# Patient Record
Sex: Male | Born: 1960 | Race: Black or African American | Hispanic: No | Marital: Single | Smoking: Current every day smoker
Health system: Southern US, Community
[De-identification: ages and names within clinical notes are randomized; demographics above are authoritative.]

## PROBLEM LIST (undated history)

## (undated) DIAGNOSIS — M419 Scoliosis, unspecified: Secondary | ICD-10-CM

## (undated) DIAGNOSIS — K5900 Constipation, unspecified: Secondary | ICD-10-CM

## (undated) DIAGNOSIS — R1010 Upper abdominal pain, unspecified: Secondary | ICD-10-CM

## (undated) HISTORY — PX: WRIST SURGERY: SHX841

---

## 2012-06-14 LAB — CBC WITH AUTOMATED DIFF
ABS. BASOPHILS: 0 10*3/uL (ref 0.0–0.2)
ABS. EOSINOPHILS: 0.1 10*3/uL (ref 0.0–0.7)
ABS. LYMPHOCYTES: 1.2 10*3/uL (ref 1.2–3.4)
ABS. MONOCYTES: 0.3 10*3/uL — ABNORMAL LOW (ref 1.1–3.2)
ABS. NEUTROPHILS: 2.1 10*3/uL (ref 1.4–6.5)
BASOPHILS: 1 % (ref 0–2)
EOSINOPHILS: 1 % (ref 0–5)
HCT: 38.6 % (ref 36.8–45.2)
HGB: 13.5 g/dL (ref 12.8–15.0)
IMMATURE GRANULOCYTES: 0.5 % (ref 0.0–5.0)
LYMPHOCYTES: 33 % (ref 16–40)
MCH: 31.6 PG — ABNORMAL HIGH (ref 27–31)
MCHC: 35 g/dL (ref 32–36)
MCV: 90.4 FL (ref 81–99)
MONOCYTES: 7 % (ref 0–12)
MPV: 9.1 FL (ref 7.4–10.4)
NEUTROPHILS: 58 % (ref 40–70)
PLATELET: 324 10*3/uL (ref 140–450)
RBC: 4.27 M/uL (ref 4.0–5.2)
RDW: 12.3 % (ref 11.5–14.5)
WBC: 3.7 10*3/uL — ABNORMAL LOW (ref 4.8–10.8)

## 2012-06-14 LAB — URINE MICROSCOPIC
Casts: NONE SEEN /LPF
Crystals, urine: NONE SEEN /LPF
RBC: 0 /HPF
WBC: 0 /HPF

## 2012-06-14 LAB — URINALYSIS W/ RFLX MICROSCOPIC
Bilirubin: NEGATIVE
Blood: NEGATIVE
Glucose: NEGATIVE MG/DL
Ketone: NEGATIVE MG/DL
Nitrites: NEGATIVE
Protein: NEGATIVE MG/DL
Specific gravity: 1.01 (ref 1.003–1.030)
Urobilinogen: 0.2 EU/DL (ref 0.2–1.0)
pH (UA): 6 (ref 5.0–7.0)

## 2012-06-14 LAB — HEPATIC FUNCTION PANEL
A-G Ratio: 0.9 — ABNORMAL LOW (ref 1.0–3.1)
ALT (SGPT): 40 U/L (ref 12.0–78.0)
AST (SGOT): 35 U/L (ref 15–37)
Albumin: 3.3 g/dL — ABNORMAL LOW (ref 3.40–5.00)
Alk. phosphatase: 88 U/L (ref 50–136)
Bilirubin, direct: 0.1 MG/DL (ref 0.0–0.20)
Bilirubin, total: 0.3 MG/DL (ref 0.20–1.00)
Globulin: 3.7 g/dL
Protein, total: 7 g/dL (ref 6.40–8.20)

## 2012-06-14 LAB — PTT: aPTT: 26.4 s (ref 24.5–31.6)

## 2012-06-14 LAB — METABOLIC PANEL, BASIC
Anion gap: 12 mmol/L (ref 10–17)
BUN/Creatinine ratio: 20 (ref 6.0–20.0)
BUN: 14 MG/DL (ref 7–18)
CO2: 28 MMOL/L (ref 21–32)
Calcium: 8.8 MG/DL (ref 8.5–10.1)
Chloride: 101 MMOL/L (ref 98–107)
Creatinine: 0.7 MG/DL (ref 0.6–1.3)
GFR est AA: >60 mL/min/{1.73_m2} (ref >60)
GFR est non-AA: >60 mL/min/{1.73_m2} (ref >60)
Glucose: 104 MG/DL (ref 74–106)
Potassium: 4.1 MMOL/L (ref 3.50–5.10)
Sodium: 137 MMOL/L (ref 136–145)

## 2012-06-14 LAB — MAGNESIUM: Magnesium: 1.9 MG/DL (ref 1.8–2.4)

## 2012-06-14 LAB — PROTHROMBIN TIME + INR
INR: 1
Prothrombin time: 10.3 s (ref 9.8–12.0)

## 2012-06-14 LAB — LIPASE: Lipase: 71 U/L (ref 65–230)

## 2012-06-14 LAB — AMYLASE: Amylase: 41 U/L (ref 25–115)

## 2012-11-26 MED ADMIN — naproxen (NAPROSYN) tablet 500 mg: ORAL | @ 20:00:00 | NDC 53746018801

## 2012-11-26 MED FILL — NAPROXEN 250 MG TAB: 250 mg | ORAL | Qty: 2

## 2012-11-26 NOTE — ED Notes (Signed)
I have reviewed discharge instructions with the patient.  The patient verbalized understanding. Provided cane ice pack applied to R. Knee with ace wrap.

## 2012-11-26 NOTE — ED Provider Notes (Signed)
I was present in the ED and available for consultation.  I have read the note and agree with the plan.

## 2012-11-26 NOTE — ED Notes (Cosign Needed)
HPI:     This is a 52 y.o. year old male presents to the ED with complaints of anterior right and posterior knee pain that started 2 days ago. Patient denies trauma, states that he woke up with pain, describes pain as tight sensation and states that it is intermittent, aggravated by weight bearing, denies numbness or tingling in legs    I have provided an initial assessment and medical screening exam and he is to be placed in [facility] for further evaluation and treatment.    Review of Systems:  1)no fever, no chills   2) no sob, no cough     Physical Exam:  There were no vitals taken for this visit.  1) RRR, normal s1 and s2   2) no obvious knee deformity, able to bear weight     ----------------------------------------------------------------------------------    Past Medical/Surgical History:  No past medical history on file.    Family History:  No family history on file.    Social History:  History   Substance Use Topics   ??? Smoking status: Not on file   ??? Smokeless tobacco: Not on file   ??? Alcohol Use: Not on file       Allergies:  Allergies not on file    Medications:  No current facility-administered medications for this encounter.     No current outpatient prescriptions on file.

## 2012-11-26 NOTE — ED Provider Notes (Addendum)
Patient is a 52 y.o. male presenting with knee pain.   Knee Pain   This is a new problem. The current episode started 2 days ago. The problem occurs constantly. The problem has not changed since onset.The pain is present in the right knee. The quality of the pain is described as aching and constant. The pain is at a severity of 7/10. The pain is moderate. Associated symptoms include limited range of motion and stiffness. Pertinent negatives include no numbness, no tingling, no itching and no back pain. The symptoms are aggravated by movement. He has tried nothing for the symptoms. There has been no history of extremity trauma.        No past medical history on file.     No past surgical history on file.      No family history on file.     History     Social History   ??? Marital Status: SINGLE     Spouse Name: N/A     Number of Children: N/A   ??? Years of Education: N/A     Occupational History   ??? Not on file.     Social History Main Topics   ??? Smoking status: Not on file   ??? Smokeless tobacco: Not on file   ??? Alcohol Use: Not on file   ??? Drug Use: Not on file   ??? Sexually Active: Not on file     Other Topics Concern   ??? Not on file     Social History Narrative   ??? No narrative on file                  ALLERGIES: Review of patient's allergies indicates not on file.      Review of Systems   Constitutional: Negative.  Negative for fever, chills and appetite change.   Respiratory: Negative.  Negative for chest tightness and shortness of breath.    Cardiovascular: Negative.  Negative for chest pain, palpitations and leg swelling.   Musculoskeletal: Positive for stiffness. Negative for myalgias, back pain, joint swelling, arthralgias and gait problem.   Skin: Negative.  Negative for color change, itching, pallor, rash and wound.   Neurological: Negative for tingling and numbness.   All other systems reviewed and are negative.        There were no vitals filed for this visit.         Physical Exam   Nursing note and vitals  reviewed.  Constitutional: He is oriented to person, place, and time. He appears well-developed and well-nourished. No distress.   HENT:   Head: Normocephalic and atraumatic.   Eyes: Conjunctivae are normal. Pupils are equal, round, and reactive to light. Right eye exhibits no discharge. Left eye exhibits no discharge.   Neck: Neck supple. No JVD present. No tracheal deviation present. No thyromegaly present.   Cardiovascular: Normal rate, regular rhythm, normal heart sounds and intact distal pulses.  Exam reveals no gallop and no friction rub.    No murmur heard.  Pulmonary/Chest: Effort normal and breath sounds normal. No stridor. No respiratory distress. He has no wheezes. He has no rales.   Abdominal: Soft. Bowel sounds are normal. He exhibits no distension and no mass. There is no tenderness. There is no rebound and no guarding.   Musculoskeletal: He exhibits tenderness. He exhibits no edema.        Right knee: He exhibits decreased range of motion and bony tenderness. He exhibits no swelling and no  effusion. Tenderness found. Medial joint line tenderness noted.        Legs:  Lymphadenopathy:     He has no cervical adenopathy.   Neurological: He is alert and oriented to person, place, and time. He has normal reflexes. He displays normal reflexes. No cranial nerve deficit. He exhibits normal muscle tone. Coordination normal.   Skin: Skin is warm and dry. No rash noted. He is not diaphoretic. No erythema. No pallor.   Psychiatric: He has a normal mood and affect. His behavior is normal. Judgment and thought content normal.        MDM     Differential Diagnosis; Clinical Impression; Plan:     52 y/o male presents with right knee pain 2 days ago pt states "not sure if I twisted it I pain houses so I could have injured it" denies any n/t/p no weakness no calf pain     Physical Exam   Nursing note and vitals reviewed.  Constitutional: he is oriented to person, place, and time. he appears well-developed and  well-nourished.   HENT:    Head: Normocephalic and atraumatic.   Musculoskeletal:he exhibits no edema and slight tenderness to right medical ligament no swelling, no warmth and ecchymosis noted  n/v intact no leg no calf pain    Neuro vasc is intact no leg no calf pain   Neurological: he is alert and oriented to person, place, and time.   Skin: Skin is warm and dry.   Psychiatric: he has a normal mood and affect. behavior is normal. Judgment and thought content normal.   EXAM: XR KNEE RT 3 V dated 11/26/2012 3:35 PM     INDICATION: pain     COMPARISON: None.     FINDINGS: 3 views of the right knee were obtained.  There is no evidence of fracture or dislocation. Joint spaces are normal. No  evidence of other soft tissue or osseous abnormality.     IMPRESSION: Normal exam.          Assessment/plan:  Knee pain  Pain management  F/u with ortho/pmd in 1-2 days         Progress:   Patient progress:  Stable      Procedures

## 2012-12-15 NOTE — ED Notes (Signed)
Pt evaluated by provider refused instructions ambulated from stretcher gait steady.

## 2012-12-15 NOTE — ED Notes (Signed)
Seen here 2 weeks ago for right knee pain, no improvement, taking extra strength tylenol for pain, pt states he has difficultly keeping his balance while ambulating 2' pain

## 2012-12-15 NOTE — ED Notes (Signed)
Mr. 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  [facility] did contact [facility] for possible detox bed but unfortunately the were no beds available today.  Patient was informed to call the facilities tomorrow for a referral.  Patient was given a list of walk-in detox facilities' contact information, and [facility]'s contact information.

## 2012-12-27 NOTE — ED Notes (Cosign Needed)
HPI: This is a 52 y.o. year old male who complains of right knee pain 1 month    Review of Systems:  1) right knee pain and swelling no injury trauma no n/t/p   2) denies any fever/chills      Past Medical/Surgical History:  Past Medical History   Diagnosis Date   ??? 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 GERD (gastroesophageal reflux disease)        Family History:  History reviewed. No pertinent family history.    Social History:  History   Substance Use Topics   ??? Smoking status: Light Tobacco Smoker -- 0.25 packs/day for 20 years   ??? Smokeless tobacco: Never Used   ??? Alcohol Use: 2.5 oz/week     5 Shots of liquor per week       Allergies:  Allergies   Allergen Reactions   ??? Motrin (Ibuprofen) Nausea Only       Physical Exam:  BP 134/86   Pulse 81   Temp(Src) 98.4 ??F (36.9 ??C)   Resp 16   Ht 5\' 9"  (1.753 m)   Wt 80.74 kg (178 lb)   BMI 26.27 kg/m2   SpO2 99%  HEENT: normocephalic/atraumatic, moist mucous membranes  LUNGS: clear to auscultation bl, nl effort  CV: RRR, nl s1 and s2, no murmurs/rubs/gallops  PSYCH: nl mood and affect  Musculoskeletal: no deformity of right knee able to ambulate ttp over medial collateral ligament dec rom on flex and extension no effusion no warmrth no erythema no leg no calf pain strength +5/5 n/v intact sensation normal     Assessment:  52 y.o. year old male with chronic knee pain 'ran out of my pain medications" need something stronger than motrin not working denies any injury/trauma no n/t/p no weakness no leg no calf pain     Plan:  Knee pain  Med refill  Pain management with naproxen pt was taking motrin not working" no true allergy  F/u with ortho    I discussed this is patient, assessment and plan with Dr. Tenny Craw   The patient was stable at discharge.

## 2013-03-26 MED ADMIN — acetaminophen (TYLENOL) tablet 650 mg: ORAL | @ 16:00:00 | NDC 50580050130

## 2013-03-26 NOTE — ED Provider Notes (Signed)
HPI Comments: Pt states he got his flu vaccine 6 days ago, has since developed nasal congestion, swollen glands in his neck, occipital headaches, and sinus pressure. No F/C, CP, cough, SOB. No change of vision. No sore throat.     Patient is a 52 y.o. male presenting with sinus pain. The history is provided by the patient.   Sinus Pain   This is a new problem. The current episode started more than 2 days ago. The problem has been gradually worsening. There has been no fever. The pain is at a severity of 6/10. The pain is moderate. The pain has been constant since onset. Associated symptoms include congestion, sinus pressure, swollen glands, rhinorrhea and headaches. Pertinent negatives include no chills, no sweats, no ear pain, no hoarse voice, no sore throat, no cough, no shortness of breath, no neck pain, no neck pain and no chest pain. Treatments tried: Advil. The treatment provided mild relief.        Past Medical History   Diagnosis Date   ??? 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 GERD (gastroesophageal reflux disease)         History reviewed. No pertinent past surgical history.      History reviewed. No pertinent family history.     History     Social History   ??? Marital Status: SINGLE     Spouse Name: N/A     Number of Children: N/A   ??? Years of Education: N/A     Occupational History   ??? Not on file.     Social History Main Topics   ??? Smoking status: Light Tobacco Smoker -- 0.25 packs/day for 20 years   ??? Smokeless tobacco: Never Used   ??? Alcohol Use: 2.5 oz/week     5 Shots of liquor per week   ??? Drug Use: No   ??? Sexually Active: Yes -- Male partner(s)     Other Topics Concern   ??? Not on file     Social History Narrative   ??? No narrative on file                  ALLERGIES: Motrin      Review of Systems   Constitutional: Negative.  Negative for fever and chills.   HENT: Positive for congestion, rhinorrhea and sinus pressure. Negative for ear pain, sore throat, hoarse voice, drooling, mouth sores,  trouble swallowing, neck pain and voice change.    Eyes: Negative for pain and visual disturbance.   Respiratory: Negative.  Negative for cough, chest tightness and shortness of breath.    Cardiovascular: Negative.  Negative for chest pain.   Gastrointestinal: Negative.  Negative for nausea, vomiting, abdominal pain and diarrhea.   Neurological: Positive for headaches. Negative for dizziness, weakness and light-headedness.   Hematological: Does not bruise/bleed easily.   All other systems reviewed and are negative.        Filed Vitals:    03/26/13 0954   BP: 138/98   Pulse: 80   Temp: 97.8 ??F (36.6 ??C)   Resp: 20   Height: 5\' 6"  (1.676 m)   Weight: 81.647 kg (180 lb)   SpO2: 99%            Physical Exam   Nursing note and vitals reviewed.  Constitutional: He is oriented to person, place, and time. He appears well-developed and well-nourished. No distress.   HENT:   Head: Normocephalic and atraumatic.  Right Ear: External ear normal.   Left Ear: External ear normal.   Nose: Mucosal edema, rhinorrhea and sinus tenderness present. No nose lacerations, nasal deformity, septal deviation or nasal septal hematoma. Epistaxis is observed.  No foreign bodies. Right sinus exhibits maxillary sinus tenderness and frontal sinus tenderness. Left sinus exhibits frontal sinus tenderness. Left sinus exhibits no maxillary sinus tenderness.   Mouth/Throat: Oropharynx is clear and moist. No oropharyngeal exudate.   Dried blood to anterior left nare. No ulcerations with turbinates.    Eyes: Conjunctivae and EOM are normal. Pupils are equal, round, and reactive to light.   Neck: Trachea normal and normal range of motion. Neck supple. Muscular tenderness present. No spinous process tenderness present. No rigidity. No tracheal deviation, no edema, no erythema and normal range of motion present. No Brudzinski's sign and no Kernig's sign noted.       Cardiovascular: Normal rate and regular rhythm.  Exam reveals no gallop and no friction rub.     No murmur heard.  Pulmonary/Chest: Effort normal and breath sounds normal. No respiratory distress. He has no wheezes. He has no rales. He exhibits no tenderness.   Abdominal: Soft. Bowel sounds are normal. There is no tenderness.   Musculoskeletal: Normal range of motion.   Lymphadenopathy:     He has cervical adenopathy.   Neurological: He is alert and oriented to person, place, and time.   Skin: Skin is warm and dry.   Psychiatric: He has a normal mood and affect. His behavior is normal. Judgment and thought content normal.        MDM     Differential Diagnosis; Clinical Impression; Plan:     52 y.o. male with sinus congestion/headache, tension headache, and myalgias s/p flu vaccination 6 days ago. Afebrile and well appearing.       Plan:     Flonase  Claritin  Tylenol with Codeine  Continue Advil  Saline nasal wash  F/up PMD          Progress:   Patient progress:  Stable      Procedures

## 2013-03-26 NOTE — ED Notes (Signed)
Report to RN Lonni Fix

## 2013-03-26 NOTE — ED Notes (Signed)
Pt is alert and verbal. NAD observed. Pt is now ready for discharge. I have reviewed discharge instructions with the patient.  The patient verbalized understanding. Patient armband removed and shredded

## 2013-03-26 NOTE — ED Notes (Signed)
Received in room  With c/o nasal congestion cough head ache and body pain. No distress. Awake and alert

## 2013-04-15 LAB — LIPASE: Lipase: 89 U/L (ref 65–230)

## 2013-04-15 LAB — URINALYSIS W/ RFLX MICROSCOPIC
Bilirubin: NEGATIVE
Blood: NEGATIVE
Glucose: NEGATIVE mg/dL
Ketone: NEGATIVE mg/dL
Leukocyte Esterase: NEGATIVE
Nitrites: NEGATIVE
Protein: NEGATIVE mg/dL
Specific gravity: 1.025 (ref 1.003–1.030)
Urobilinogen: 0.2 EU/dL (ref 0.2–1.0)
pH (UA): 5.5 (ref 5.0–7.0)

## 2013-04-15 LAB — CBC WITH AUTOMATED DIFF
ABS. BASOPHILS: 0 10*3/uL (ref 0.0–0.2)
ABS. EOSINOPHILS: 0.1 10*3/uL (ref 0.0–0.7)
ABS. LYMPHOCYTES: 1.9 10*3/uL (ref 1.2–3.4)
ABS. MONOCYTES: 0.4 10*3/uL — ABNORMAL LOW (ref 1.1–3.2)
ABS. NEUTROPHILS: 2.1 10*3/uL (ref 1.4–6.5)
BASOPHILS: 1 % (ref 0–2)
EOSINOPHILS: 2 % (ref 0–5)
HCT: 40.3 % (ref 36.8–45.2)
HGB: 14 g/dL (ref 12.8–15.0)
IMMATURE GRANULOCYTES: 0.2 % (ref 0.0–5.0)
LYMPHOCYTES: 42 % — ABNORMAL HIGH (ref 16–40)
MCH: 31.2 PG — ABNORMAL HIGH (ref 27–31)
MCHC: 34.7 g/dL (ref 32–36)
MCV: 89.8 FL (ref 81–99)
MONOCYTES: 10 % (ref 0–12)
MPV: 8.8 FL (ref 7.4–10.4)
NEUTROPHILS: 45 % (ref 40–70)
PLATELET: 296 10*3/uL (ref 140–450)
RBC: 4.49 M/uL (ref 4.0–5.2)
RDW: 12.5 % (ref 11.5–14.5)
WBC: 4.5 10*3/uL — ABNORMAL LOW (ref 4.8–10.8)

## 2013-04-15 LAB — METABOLIC PANEL, COMPREHENSIVE
A-G Ratio: 1.1 (ref 1.0–3.1)
ALT (SGPT): 37 U/L (ref 12.0–78.0)
AST (SGOT): 39 U/L — ABNORMAL HIGH (ref 15–37)
Albumin: 3.8 g/dL (ref 3.40–5.00)
Alk. phosphatase: 73 U/L (ref 46–116)
Anion gap: 12 mmol/L (ref 10–17)
BUN/Creatinine ratio: 16 (ref 6.0–20.0)
BUN: 13 MG/DL (ref 7–18)
Bilirubin, total: 0.6 MG/DL (ref 0.20–1.00)
CO2: 29 mmol/L (ref 21–32)
Calcium: 9.1 MG/DL (ref 8.5–10.1)
Chloride: 103 mmol/L (ref 98–107)
Creatinine: 0.8 MG/DL (ref 0.6–1.3)
GFR est AA: >60 mL/min/{1.73_m2} (ref >60)
GFR est non-AA: >60 mL/min/{1.73_m2} (ref >60)
Globulin: 3.4 g/dL
Glucose: 82 mg/dL (ref 74–106)
Potassium: 4.7 mmol/L (ref 3.50–5.10)
Protein, total: 7.2 g/dL (ref 6.40–8.20)
Sodium: 139 mmol/L (ref 136–145)

## 2013-04-15 MED ADMIN — sodium chloride (NS) flush 5-10 mL: INTRAVENOUS | @ 18:00:00 | NDC 87701099893

## 2013-04-15 MED ADMIN — pantoprazole (PROTONIX) 40 mg in sodium chloride 0.9 % 10 mL injection: INTRAVENOUS | @ 18:00:00 | NDC 00409488810

## 2013-04-15 MED ADMIN — diatrizoate meglumine-d.sodium (MD-GASTROVIEW,GASTROGRAFIN) 66-10 % contrast solution 30 mL: ORAL | @ 20:00:00 | NDC 00019481604

## 2013-04-15 MED ADMIN — dicyclomine (BENTYL) capsule 10 mg: ORAL | @ 18:00:00 | NDC 51079011801

## 2013-04-15 MED ADMIN — sodium chloride 0.9 % bolus infusion 1,000 mL: INTRAVENOUS | @ 18:00:00 | NDC 00338004904

## 2013-04-15 MED ADMIN — ioversol (OPTIRAY) 320 mg iodine/mL contrast injection 100 mL: INTRAVENOUS | @ 21:00:00 | NDC 00019132311

## 2013-04-15 MED ADMIN — sodium chloride (NS) flush 5-10 mL: INTRAVENOUS | @ 22:00:00 | NDC 87701099893

## 2013-04-15 NOTE — ED Notes (Signed)
Stomach pain x3 days, pt saw PCP yesterday and was prescribed gabapentin for "leg pain". Reports dizziness, denies nausea/ vomiting or constipation, Reports gastric reflux symptoms, "flames come up" in esophagus. VSS, pt A&O x3, no apparent distress at this time.

## 2013-04-15 NOTE — Other (Signed)
Pt was tested for HIV.  Test result is negative.

## 2013-04-15 NOTE — ED Notes (Signed)
Pt finished PO contrast 15 min ago, CT made aware.

## 2013-04-15 NOTE — ED Notes (Signed)
Pt states " I am feeling much,much better!"  MD aware.

## 2013-04-15 NOTE — ED Notes (Signed)
I have reviewed discharge instructions and prescriptions with the patient.  The patient verbalized understanding. Pt VSS, eating and drinking without distress.

## 2013-04-15 NOTE — ED Notes (Signed)
Pt up to bathroom, ambulates with strong steady gait

## 2013-04-15 NOTE — ED Notes (Signed)
Pt medicated per MD Roxan Hockey orders. Pt resting in bed, no apparent distress at this time.

## 2013-04-15 NOTE — ED Provider Notes (Signed)
Patient is a 52 y.o. male presenting with abdominal pain and vomiting. The history is provided by the patient.   Abdominal Pain   This is a new problem. The current episode started more than 2 days ago. The problem occurs daily. The problem has not changed since onset.The pain is located in the RUQ. The quality of the pain is sharp. The pain is moderate. Associated symptoms include nausea and vomiting. Pertinent negatives include no fever, no dysuria and no hematuria. Nothing worsens the pain. The pain is relieved by nothing.   Vomiting   Associated symptoms include abdominal pain. Pertinent negatives include no chills, no fever and no cough.        Past Medical History   Diagnosis Date   ??? 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 GERD (gastroesophageal reflux disease)         History reviewed. No pertinent past surgical history.      History reviewed. No pertinent family history.     History     Social History   ??? Marital Status: SINGLE     Spouse Name: N/A     Number of Children: N/A   ??? Years of Education: N/A     Occupational History   ??? Not on file.     Social History Main Topics   ??? Smoking status: Light Tobacco Smoker -- 0.25 packs/day for 20 years   ??? Smokeless tobacco: Never Used   ??? Alcohol Use: 2.5 oz/week     5 Shots of liquor per week   ??? Drug Use: No   ??? Sexually Active: Yes -- Male partner(s)     Other Topics Concern   ??? Not on file     Social History Narrative   ??? No narrative on file                  ALLERGIES: Motrin      Review of Systems   Constitutional: Negative for fever and chills.   HENT: Negative for sore throat and trouble swallowing.    Respiratory: Negative for cough and shortness of breath.    Gastrointestinal: Positive for nausea, vomiting and abdominal pain.   Genitourinary: Negative for dysuria and hematuria.   Skin: Negative for rash and wound.   Neurological: Negative for dizziness and light-headedness.   Psychiatric/Behavioral: Negative for confusion and agitation.        Filed Vitals:    04/15/13 1108 04/15/13 1258 04/15/13 1301   BP: 149/99 148/118 133/87   Pulse: 82     Temp: 98.9 ??F (37.2 ??C)     Resp: 18 16    Height: 5\' 9"  (1.753 m)     Weight: 83.008 kg (183 lb)     SpO2: 100% 99%             Physical Exam   Nursing note and vitals reviewed.  Constitutional: He is oriented to person, place, and time. He appears well-developed and well-nourished.   HENT:   Head: Normocephalic and atraumatic.   Eyes: Conjunctivae are normal. Pupils are equal, round, and reactive to light.   Neck: Normal range of motion. Neck supple.   Cardiovascular: Normal rate and regular rhythm.    Pulmonary/Chest: Effort normal and breath sounds normal.   Abdominal: Soft. Bowel sounds are normal. There is tenderness (right upper and mid quad). There is no rebound and no guarding.   Musculoskeletal: Normal range of motion.   Neurological: He is  alert and oriented to person, place, and time.   Skin: Skin is warm and dry.   Psychiatric: He has a normal mood and affect. His behavior is normal. Judgment and thought content normal.        MDM     Differential Diagnosis; Clinical Impression; Plan:     52 y.o. male with abd pain      Plan:     Labs  Ct Scan          Progress:   Patient progress:  Stable      Procedures    Labs Reviewed   METABOLIC PANEL, COMPREHENSIVE - Abnormal; Notable for the following:     AST 39 (*)     All other components within normal limits   CBC WITH AUTOMATED DIFF - Abnormal; Notable for the following:     WBC 4.5 (*)     MCH 31.2 (*)     LYMPHOCYTES 42 (*)     ABS. MONOCYTES 0.4 (*)     All other components within normal limits   LIPASE   URINALYSIS W/ RFLX MICROSCOPIC   METABOLIC PANEL, COMPREHENSIVE   LIPASE   CBC WITH AUTOMATED DIFF   URINALYSIS W/ RFLX MICROSCOPIC     2:11 PM  Pt feels better but still continuing to have pain. Will get ct scan of abd. Belly exam with tenderness right upper quad. No guarding and rebound.    Ct with no acute pathology. Will discharge patient home at  this time with antacids and to f/u with pcd.

## 2013-04-15 NOTE — ED Notes (Signed)
Pt to bathroom, ambulated independently.

## 2013-04-15 NOTE — ED Notes (Signed)
Pt resting in bed, VSS, requesting to eat.

## 2013-05-06 NOTE — Progress Notes (Signed)
05/06/2013  11:14 AM    Dylan Sharp    [id number]  1960/07/22    Subjective:   Dylan Sharp is an 52 y.o. male who presents with right knee pain, woke up with pain. No trauma, pain back of the knee, use to swell.   Allergies   Allergen Reactions   ??? Motrin [Ibuprofen] Nausea Only     Current Outpatient Prescriptions   Medication Sig Dispense Refill   ??? fluticasone (FLONASE) 50 mcg/actuation nasal spray 2 sprays by Both Nostrils route daily.  1 Bottle  0   ??? loratadine (CLARITIN) 10 mg tablet Take 1 tablet by mouth daily.  20 tablet  0   ??? sodium chloride (SALINE NASAL) 0.65 % nasal spray 1 spray by Both Nostrils route as needed for Congestion.  15 mL  0   ??? naproxen (NAPROSYN) 500 mg tablet Take 1 Tab by mouth two (2) times daily (with meals).  20 Tab  0   ??? [EXPIRED] lansoprazole (PREVACID) 30 mg capsule Take 1 capsule by mouth daily for 20 days.  20 capsule  0     There is no problem list on file for this patient.    History reviewed. No pertinent past surgical history.    Review of Systems  Pertinent items are noted in HPI.    Objective:   BP 127/91   Pulse 81   Temp(Src) 98.2 ??F (36.8 ??C) (Oral)   Resp 14   Ht 5\' 9"  (1.753 m)   Wt 186 lb (84.369 kg)   BMI 27.45 kg/m2   SpO2 97%      Exam: right knee, no effusion, some tenderness over the medial joint line, [place] with internal rotated with some pain, not with external rotated position. Lig is intact  Assessment:    [id number]   Possible torn medial menicus, right knee.      Plan:  xrays and MRI right knee.  Signed By: Joni Fears, MD                       May 06, 2013

## 2013-05-06 NOTE — Patient Instructions (Signed)
Have your doctor schedule xrays right knee and MRI of the right knee without contrast of the right knee, for possible torn medial meniscus.

## 2013-07-03 LAB — METABOLIC PANEL, COMPREHENSIVE
A-G Ratio: 1.1 (ref 1.0–3.1)
ALT (SGPT): 32 U/L (ref 12.0–78.0)
AST (SGOT): 30 U/L (ref 15–37)
Albumin: 3.6 g/dL (ref 3.40–5.00)
Alk. phosphatase: 69 U/L (ref 46–116)
Anion gap: 12 mmol/L (ref 10–17)
BUN/Creatinine ratio: 19 (ref 6.0–20.0)
BUN: 13 MG/DL (ref 7–18)
Bilirubin, total: 0.6 MG/DL (ref 0.20–1.00)
CO2: 29 mmol/L (ref 21–32)
Calcium: 9.1 MG/DL (ref 8.5–10.1)
Chloride: 102 mmol/L (ref 98–107)
Creatinine: 0.7 MG/DL (ref 0.6–1.3)
GFR est AA: >60 mL/min/{1.73_m2} (ref >60)
GFR est non-AA: >60 mL/min/{1.73_m2} (ref >60)
Globulin: 3.4 g/dL
Glucose: 108 mg/dL — ABNORMAL HIGH (ref 74–106)
Potassium: 4.4 mmol/L (ref 3.50–5.10)
Protein, total: 7 g/dL (ref 6.40–8.20)
Sodium: 139 mmol/L (ref 136–145)

## 2013-07-03 LAB — CBC WITH AUTOMATED DIFF
ABS. BASOPHILS: 0 10*3/uL (ref 0.0–0.2)
ABS. EOSINOPHILS: 0.1 10*3/uL (ref 0.0–0.7)
ABS. LYMPHOCYTES: 1.5 10*3/uL (ref 1.2–3.4)
ABS. MONOCYTES: 0.3 10*3/uL — ABNORMAL LOW (ref 1.1–3.2)
ABS. NEUTROPHILS: 2.5 10*3/uL (ref 1.4–6.5)
BASOPHILS: 0 % (ref 0–2)
EOSINOPHILS: 1 % (ref 0–5)
HCT: 37.1 % (ref 36.8–45.2)
HGB: 12.9 g/dL (ref 12.8–15.0)
IMMATURE GRANULOCYTES: 0.2 % (ref 0.0–5.0)
LYMPHOCYTES: 34 % (ref 16–40)
MCH: 31.1 PG — ABNORMAL HIGH (ref 27–31)
MCHC: 34.8 g/dL (ref 32–36)
MCV: 89.4 FL (ref 81–99)
MONOCYTES: 8 % (ref 0–12)
MPV: 8.6 FL (ref 7.4–10.4)
NEUTROPHILS: 57 % (ref 40–70)
PLATELET: 281 10*3/uL (ref 140–450)
RBC: 4.15 M/uL (ref 4.0–5.2)
RDW: 13 % (ref 11.5–14.5)
WBC: 4.4 10*3/uL — ABNORMAL LOW (ref 4.8–10.8)

## 2013-07-03 LAB — LIPASE: Lipase: 120 U/L (ref 65–230)

## 2013-07-03 MED ORDER — SODIUM CHLORIDE 0.9 % INJECTION
40 mg | INTRAMUSCULAR | Status: AC
Start: 2013-07-03 — End: 2013-07-03
  Administered 2013-07-03: 19:00:00 via INTRAVENOUS

## 2013-07-03 MED ORDER — SODIUM CHLORIDE 0.9 % IJ SYRG
INTRAMUSCULAR | Status: DC | PRN
Start: 2013-07-03 — End: 2013-07-03
  Administered 2013-07-03: 19:00:00 via INTRAVENOUS

## 2013-07-03 MED ORDER — METOCLOPRAMIDE 5 MG/ML IJ SOLN
5 mg/mL | INTRAMUSCULAR | Status: AC
Start: 2013-07-03 — End: 2013-07-03
  Administered 2013-07-03: 19:00:00 via INTRAVENOUS

## 2013-07-03 MED ORDER — SODIUM CHLORIDE 0.9 % IJ SYRG
Freq: Three times a day (TID) | INTRAMUSCULAR | Status: DC
Start: 2013-07-03 — End: 2013-07-03
  Administered 2013-07-03: 19:00:00 via INTRAVENOUS

## 2013-07-03 MED ORDER — LIDOCAINE 2 % MUCOSAL SOLN
2 % | Status: AC
Start: 2013-07-03 — End: 2013-07-03
  Administered 2013-07-03: 19:00:00 via OROMUCOSAL

## 2013-07-03 MED FILL — SODIUM CHLORIDE 0.9 % IJ SYRG: INTRAMUSCULAR | Qty: 10

## 2013-07-03 MED FILL — METOCLOPRAMIDE 5 MG/ML IJ SOLN: 5 mg/mL | INTRAMUSCULAR | Qty: 2

## 2013-07-03 MED FILL — LIDOCAINE VISCOUS 2 % MUCOSAL SOLUTION: 2 % | Qty: 15

## 2013-07-03 MED FILL — PROTONIX 40 MG INTRAVENOUS SOLUTION: 40 mg | INTRAVENOUS | Qty: 40

## 2013-07-03 NOTE — ED Notes (Signed)
Pt given ginger ale and cracker, tolerated PO well, MD made aware

## 2013-07-03 NOTE — ED Provider Notes (Signed)
Patient is a 53 y.o. male presenting with abdominal pain. The history is provided by the patient.   Abdominal Pain   This is a new problem. The current episode started 2 days ago. The problem occurs constantly. The problem has not changed since onset.The pain is located in the RUQ. The quality of the pain is burning. The pain is moderate. Pertinent negatives include no fever, no diarrhea, no nausea, no vomiting, no dysuria, no hematuria and no back pain. Nothing worsens the pain. The pain is relieved by nothing.      The pt has taken pepcid and zantac without relief.     Past Medical History   Diagnosis Date   ??? 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 GERD (gastroesophageal reflux disease)         History reviewed. No pertinent past surgical history.      Family History   Problem Relation Age of Onset   ??? Hypertension Mother    ??? Diabetes Mother    ??? Hypertension Father    ??? Diabetes Father    ??? Diabetes Sister    ??? Diabetes Brother         History     Social History   ??? Marital Status: SINGLE     Spouse Name: N/A     Number of Children: N/A   ??? Years of Education: N/A     Occupational History   ??? Not on file.     Social History Main Topics   ??? Smoking status: Light Tobacco Smoker -- 0.25 packs/day for 20 years   ??? Smokeless tobacco: Never Used   ??? Alcohol Use: 2.5 oz/week     5 Shots of liquor per week   ??? Drug Use: No   ??? Sexually Active: Yes -- Male partner(s)     Other Topics Concern   ??? Not on file     Social History Narrative   ??? No narrative on file                  ALLERGIES: Motrin      Review of Systems   Constitutional: Negative for fever and chills.   Eyes: Negative for pain and redness.   Respiratory: Negative for cough and choking.    Gastrointestinal: Positive for abdominal pain. Negative for nausea, vomiting and diarrhea.   Genitourinary: Negative for dysuria and hematuria.   Musculoskeletal: Negative for back pain and gait problem.   Skin: Negative for rash and wound.        Knot on flank for  two days   Neurological: Negative for dizziness and light-headedness.   Psychiatric/Behavioral: Negative for confusion and agitation.       Filed Vitals:    07/03/13 1332   BP: 126/67   Pulse: 112   Temp: 97 ??F (36.1 ??C)   Resp: 20   Height: 5\' 7"  (1.702 m)   Weight: 72.576 kg (160 lb)   SpO2: 100%            Physical Exam   Nursing note and vitals reviewed.  Constitutional: He is oriented to person, place, and time. He appears well-developed and well-nourished.   HENT:   Head: Normocephalic and atraumatic.   Eyes: Conjunctivae and EOM are normal. Pupils are equal, round, and reactive to light.   Neck: Normal range of motion. Neck supple.   Cardiovascular: Normal rate and regular rhythm.    Pulmonary/Chest: Effort normal and breath  sounds normal.   Abdominal: Soft. There is tenderness (right upper quad). There is no rebound and no guarding.   Musculoskeletal: Normal range of motion.   Neurological: He is alert and oriented to person, place, and time.   Skin: Skin is warm and dry.   Psychiatric: He has a normal mood and affect. His behavior is normal. Judgment and thought content normal.        MDM     Differential Diagnosis; Clinical Impression; Plan:     53 y.o. male with h/o gerd and abd pain and burning      Plan:     Labs  fluids          Progress:   Patient progress:  Stable      Procedures    Pt signed out to Dr. Jeanice Lim to re-evaluate

## 2013-07-03 NOTE — ED Notes (Signed)
Pt c/o right side and stomach pain. Pain 10/10, sharp. x4 days, Pt states it hurts more with eating and walking. Denies nausea, vomiting, diarrhea, or difficulty with urination. Reports h/o ulcers, taking zantac, last had this morning.

## 2013-07-03 NOTE — ED Notes (Signed)
Pt sleeping in bed, no apparent distress, will continue to monitor and assess.

## 2013-07-03 NOTE — ED Notes (Signed)
1500: Pt signed out to me pending lipase level. Pt re-evaluated, reports improved pain.  On exam, abdomen is soft, non tender RUQ.  Pt would like to eat. Will po trial and if tolerated will DC.    1530: lipase 120    1600: Pt tolerated po.  Will DC home at this time.

## 2013-07-03 NOTE — ED Notes (Signed)
I have reviewed discharge instructions and prescriptions with the patient.  The patient verbalized understanding. VSS, no distress noted at this time

## 2013-07-03 NOTE — ED Notes (Signed)
Lab called to confirm add on of lipase level.

## 2013-07-10 LAB — METABOLIC PANEL, COMPREHENSIVE
A-G Ratio: 1.1 (ref 1.0–3.1)
ALT (SGPT): 29 U/L (ref 12.0–78.0)
AST (SGOT): 21 U/L (ref 15–37)
Albumin: 3.6 g/dL (ref 3.40–5.00)
Alk. phosphatase: 78 U/L (ref 46–116)
Anion gap: 12 mmol/L (ref 10–17)
BUN/Creatinine ratio: 11 (ref 6.0–20.0)
BUN: 10 MG/DL (ref 7–18)
Bilirubin, total: 0.2 MG/DL (ref 0.20–1.00)
CO2: 30 mmol/L (ref 21–32)
Calcium: 9 MG/DL (ref 8.5–10.1)
Chloride: 104 mmol/L (ref 98–107)
Creatinine: 0.9 MG/DL (ref 0.6–1.3)
GFR est AA: >60 mL/min/{1.73_m2} (ref >60)
GFR est non-AA: >60 mL/min/{1.73_m2} (ref >60)
Globulin: 3.3 g/dL
Glucose: 88 mg/dL (ref 74–106)
Potassium: 4.2 mmol/L (ref 3.50–5.10)
Protein, total: 6.9 g/dL (ref 6.40–8.20)
Sodium: 142 mmol/L (ref 136–145)

## 2013-07-10 LAB — CBC WITH AUTOMATED DIFF
ABS. BASOPHILS: 0 10*3/uL (ref 0.0–0.2)
ABS. EOSINOPHILS: 0.1 10*3/uL (ref 0.0–0.7)
ABS. LYMPHOCYTES: 1.8 10*3/uL (ref 1.2–3.4)
ABS. MONOCYTES: 0.2 10*3/uL — ABNORMAL LOW (ref 1.1–3.2)
ABS. NEUTROPHILS: 1.5 10*3/uL (ref 1.4–6.5)
BASOPHILS: 1 % (ref 0–2)
EOSINOPHILS: 2 % (ref 0–5)
HCT: 38.7 % (ref 36.8–45.2)
HGB: 13.4 g/dL (ref 12.8–15.0)
IMMATURE GRANULOCYTES: 0.5 % (ref 0.0–5.0)
LYMPHOCYTES: 50 % — ABNORMAL HIGH (ref 16–40)
MCH: 31.3 PG — ABNORMAL HIGH (ref 27–31)
MCHC: 34.6 g/dL (ref 32–36)
MCV: 90.4 FL (ref 81–99)
MONOCYTES: 6 % (ref 0–12)
MPV: 8.8 FL (ref 7.4–10.4)
NEUTROPHILS: 41 % (ref 40–70)
PLATELET: 300 10*3/uL (ref 140–450)
RBC: 4.28 M/uL (ref 4.0–5.2)
RDW: 12.9 % (ref 11.5–14.5)
WBC: 3.7 10*3/uL — ABNORMAL LOW (ref 4.8–10.8)

## 2013-07-10 LAB — URINALYSIS W/ RFLX MICROSCOPIC
Bilirubin: NEGATIVE
Blood: NEGATIVE
Glucose: NEGATIVE mg/dL
Ketone: NEGATIVE mg/dL
Leukocyte Esterase: NEGATIVE
Nitrites: NEGATIVE
Protein: NEGATIVE mg/dL
Specific gravity: 1.02 (ref 1.003–1.030)
Urobilinogen: 0.2 EU/dL (ref 0.2–1.0)
pH (UA): 7 (ref 5.0–7.0)

## 2013-07-10 LAB — LIPASE: Lipase: 111 U/L (ref 65–230)

## 2013-07-10 MED ORDER — DICYCLOMINE 10 MG CAP
10 mg | ORAL_CAPSULE | Freq: Three times a day (TID) | ORAL | Status: AC
Start: 2013-07-10 — End: 2013-07-15

## 2013-07-10 MED ORDER — PANTOPRAZOLE 40 MG IV SOLR
40 mg | INTRAVENOUS | Status: AC
Start: 2013-07-10 — End: 2013-07-10
  Administered 2013-07-10: 17:00:00 via INTRAVENOUS

## 2013-07-10 MED ORDER — DIATRIZOATE MEGLUMINE & SODIUM 66 %-10 % ORAL SOLN
66-10 % | ORAL | Status: AC
Start: 2013-07-10 — End: 2013-07-10
  Administered 2013-07-10: 17:00:00 via ORAL

## 2013-07-10 MED ORDER — SODIUM CHLORIDE 0.9 % IJ SYRG
Freq: Three times a day (TID) | INTRAMUSCULAR | Status: DC
Start: 2013-07-10 — End: 2013-07-10

## 2013-07-10 MED ORDER — IODIXANOL 320 MG/ML IV SOLN
320 mg iodine/mL | Freq: Once | INTRAVENOUS | Status: AC
Start: 2013-07-10 — End: 2013-07-10
  Administered 2013-07-10: 19:00:00 via INTRAVENOUS

## 2013-07-10 MED ORDER — SODIUM CHLORIDE 0.9 % IJ SYRG
INTRAMUSCULAR | Status: DC | PRN
Start: 2013-07-10 — End: 2013-07-10
  Administered 2013-07-10: 17:00:00 via INTRAVENOUS

## 2013-07-10 MED FILL — SODIUM CHLORIDE 0.9 % IJ SYRG: INTRAMUSCULAR | Qty: 70

## 2013-07-10 MED FILL — VISIPAQUE 320 MG IODINE/ML INTRAVENOUS SOLUTION: 320 mg iodine/mL | INTRAVENOUS | Qty: 100

## 2013-07-10 MED FILL — SODIUM CHLORIDE 0.9 % INJECTION: INTRAMUSCULAR | Qty: 10

## 2013-07-10 MED FILL — MD-GASTROVIEW 66 %-10 % ORAL SOLUTION: 66-10 % | ORAL | Qty: 30

## 2013-07-10 NOTE — ED Notes (Addendum)
Pt states having abdominal pain that is burring in sensation to RUQ and RLQ for over a week pt was evaluated in emergency department on 07/03/13 yet reports medication that were given to him have not provided any relief.  denies any nausea or vomiting also reports a burning sensation while urinating that has began today.

## 2013-07-10 NOTE — ED Provider Notes (Signed)
Patient is a 53 y.o. male presenting with abdominal pain and urinary pain. The history is provided by the patient.   Abdominal Pain   This is a recurrent problem. The current episode started more than 1 week ago. The problem occurs constantly. The problem has not changed since onset.The pain is located in the RUQ. The quality of the pain is burning. The pain is moderate. Associated symptoms include dysuria. Pertinent negatives include no fever, no diarrhea, no nausea, no vomiting and no hematuria. Nothing worsens the pain. The pain is relieved by nothing. His past medical history is significant for GERD.   Urinary Pain   Associated symptoms include abdominal pain. Pertinent negatives include no chills, no nausea, no vomiting and no hematuria.        Past Medical History   Diagnosis Date   ??? 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 GERD (gastroesophageal reflux disease)         History reviewed. No pertinent past surgical history.      Family History   Problem Relation Age of Onset   ??? Hypertension Mother    ??? Diabetes Mother    ??? Hypertension Father    ??? Diabetes Father    ??? Diabetes Sister    ??? Diabetes Brother         History     Social History   ??? Marital Status: SINGLE     Spouse Name: N/A     Number of Children: N/A   ??? Years of Education: N/A     Occupational History   ??? Not on file.     Social History Main Topics   ??? Smoking status: Light Tobacco Smoker -- 0.25 packs/day for 20 years   ??? Smokeless tobacco: Never Used   ??? Alcohol Use: 2.5 oz/week     5 Shots of liquor per week   ??? Drug Use: No   ??? Sexual Activity: Not Currently     Other Topics Concern   ??? Not on file     Social History Narrative                  ALLERGIES: Motrin      Review of Systems   Constitutional: Negative for fever and chills.   Respiratory: Negative for cough and shortness of breath.    Gastrointestinal: Positive for abdominal pain. Negative for nausea, vomiting and diarrhea.   Genitourinary: Positive for dysuria. Negative for  hematuria.   Musculoskeletal: Negative for joint swelling and gait problem.   Skin: Negative for rash and wound.   Allergic/Immunologic: Negative for immunocompromised state.   Neurological: Negative for dizziness and light-headedness.   Psychiatric/Behavioral: Negative for confusion and agitation.       Filed Vitals:    07/10/13 1129   BP: 137/90   Pulse: 82   Resp: 20   Height: 5\' 9"  (1.753 m)   Weight: 78.019 kg (172 lb)   SpO2: 100%            Physical Exam   Constitutional: He is oriented to person, place, and time. He appears well-developed and well-nourished.   HENT:   Head: Normocephalic and atraumatic.   Eyes: Conjunctivae and EOM are normal. Pupils are equal, round, and reactive to light.   Neck: Normal range of motion. Neck supple.   Cardiovascular: Normal rate and regular rhythm.    Pulmonary/Chest: Effort normal and breath sounds normal.   Abdominal: Soft. He exhibits no  distension. There is tenderness. There is no rebound and no guarding.   Musculoskeletal: Normal range of motion.   Neurological: He is alert and oriented to person, place, and time.   Skin: Skin is warm and dry.   Psychiatric: He has a normal mood and affect. His behavior is normal. Judgment and thought content normal.   Nursing note and vitals reviewed.       MDM  Number of Diagnoses or Management Options  Abdominal pain, right upper quadrant:   Diagnosis management comments: 53 y.o. male with abd pain with repeat visit for similar complaints last week.  DDX: GERD, biliary disease, PUD  Pt is on antacids at this time.    Plan:     Ct scan of abd  LAbs          Procedures    Pt with  CT scan that shows no findings to suggest biliary disease. It is recommended that pt f/u with GI specialist for further eval of pain. Repeat labs as compared to last week with no signficant change    Labs Reviewed   CBC WITH AUTOMATED DIFF - Abnormal; Notable for the following:     WBC 3.7 (*)     MCH 31.3 (*)     LYMPHOCYTES 50 (*)     ABS. MONOCYTES 0.2  (*)     All other components within normal limits   METABOLIC PANEL, COMPREHENSIVE   LIPASE   URINALYSIS W/ RFLX MICROSCOPIC   URINALYSIS W/ RFLX MICROSCOPIC

## 2014-04-13 ENCOUNTER — Emergency Department: Admit: 2014-04-13 | Payer: PRIVATE HEALTH INSURANCE | Primary: Family Medicine

## 2014-04-13 ENCOUNTER — Inpatient Hospital Stay
Admit: 2014-04-13 | Discharge: 2014-04-13 | Disposition: A | Discharge status: Home / Self Care | Payer: PRIVATE HEALTH INSURANCE | Attending: Specialist

## 2014-04-13 DIAGNOSIS — K219 Gastro-esophageal reflux disease without esophagitis: Secondary | ICD-10-CM

## 2014-04-13 LAB — METABOLIC PANEL, COMPREHENSIVE
A-G Ratio: 1 (ref 1.0–3.1)
ALT (SGPT): 35 U/L (ref 12.0–78.0)
AST (SGOT): 33 U/L (ref 15–37)
Albumin: 3.4 g/dL (ref 3.40–5.00)
Alk. phosphatase: 68 U/L (ref 46–116)
Anion gap: 10 mmol/L (ref 10–17)
BUN/Creatinine ratio: 15 (ref 6.0–20.0)
BUN: 9 MG/DL (ref 7–18)
Bilirubin, total: 0.3 MG/DL (ref 0.20–1.00)
CO2: 30 mmol/L (ref 21–32)
Calcium: 9 MG/DL (ref 8.5–10.1)
Chloride: 109 mmol/L — ABNORMAL HIGH (ref 98–107)
Creatinine: 0.6 MG/DL (ref 0.6–1.3)
GFR est AA: >60 mL/min/{1.73_m2} (ref >60)
GFR est non-AA: >60 mL/min/{1.73_m2} (ref >60)
Globulin: 3.3 g/dL
Glucose: 110 mg/dL — ABNORMAL HIGH (ref 74–106)
Potassium: 4.2 mmol/L (ref 3.50–5.10)
Protein, total: 6.7 g/dL (ref 6.40–8.20)
Sodium: 145 mmol/L (ref 136–145)

## 2014-04-13 LAB — CBC WITH AUTOMATED DIFF
ABS. BASOPHILS: 0 10*3/uL (ref 0.0–0.2)
ABS. EOSINOPHILS: 0.1 10*3/uL (ref 0.0–0.7)
ABS. IMM. GRANS.: 0 10*3/uL (ref 0.0–0.5)
ABS. LYMPHOCYTES: 1.4 10*3/uL (ref 1.2–3.4)
ABS. MONOCYTES: 0.3 10*3/uL — ABNORMAL LOW (ref 1.1–3.2)
ABS. NEUTROPHILS: 1.7 10*3/uL (ref 1.4–6.5)
BASOPHILS: 1 % (ref 0–2)
EOSINOPHILS: 3 % (ref 0–5)
HCT: 40.7 % (ref 36.8–45.2)
HGB: 14 g/dL (ref 12.8–15.0)
IMMATURE GRANULOCYTES: 0.3 % (ref 0.0–5.0)
LYMPHOCYTES: 41 % — ABNORMAL HIGH (ref 16–40)
MCH: 32 PG — ABNORMAL HIGH (ref 27–31)
MCHC: 34.4 g/dL (ref 32–36)
MCV: 93.1 FL (ref 81–99)
MONOCYTES: 8 % (ref 0–12)
MPV: 9.5 FL (ref 7.4–10.4)
NEUTROPHILS: 47 % (ref 40–70)
PLATELET: 269 10*3/uL (ref 140–450)
RBC: 4.37 M/uL (ref 4.0–5.2)
RDW: 12.4 % (ref 11.5–14.5)
WBC: 3.5 10*3/uL — ABNORMAL LOW (ref 4.8–10.0)

## 2014-04-13 LAB — LIPASE: Lipase: 123 U/L (ref 65–230)

## 2014-04-13 MED ORDER — METOCLOPRAMIDE 10 MG TAB
10 mg | ORAL_TABLET | Freq: Four times a day (QID) | ORAL | Status: AC | PRN
Start: 2014-04-13 — End: 2014-04-23

## 2014-04-13 MED ORDER — FAMOTIDINE 20 MG TAB
20 mg | ORAL_TABLET | Freq: Two times a day (BID) | ORAL | Status: AC
Start: 2014-04-13 — End: 2014-04-23

## 2014-04-13 MED ORDER — LIDOCAINE 2 % MUCOSAL SOLN
2 % | Status: AC
Start: 2014-04-13 — End: 2014-04-13
  Administered 2014-04-13: 16:00:00 via OROMUCOSAL

## 2014-04-13 MED ORDER — SODIUM CHLORIDE 0.9 % INJECTION
40 mg | INTRAMUSCULAR | Status: AC
Start: 2014-04-13 — End: 2014-04-13
  Administered 2014-04-13: 16:00:00 via INTRAVENOUS

## 2014-04-13 MED ORDER — METOCLOPRAMIDE 5 MG/ML IJ SOLN
5 mg/mL | INTRAMUSCULAR | Status: AC
Start: 2014-04-13 — End: 2014-04-13
  Administered 2014-04-13: 16:00:00 via INTRAVENOUS

## 2014-04-13 MED ORDER — FAMOTIDINE 20 MG TAB
20 mg | ORAL | Status: AC
Start: 2014-04-13 — End: 2014-04-13
  Administered 2014-04-13: 19:00:00 via ORAL

## 2014-04-13 MED ORDER — SODIUM CHLORIDE 0.9% BOLUS IV
0.9 % | INTRAVENOUS | Status: AC
Start: 2014-04-13 — End: 2014-04-13
  Administered 2014-04-13: 16:00:00 via INTRAVENOUS

## 2014-04-13 MED ORDER — TRAMADOL 50 MG TAB
50 mg | ORAL | Status: AC
Start: 2014-04-13 — End: 2014-04-13
  Administered 2014-04-13: 16:00:00 via ORAL

## 2014-04-13 MED FILL — LIDOCAINE VISCOUS 2 % MUCOSAL SOLUTION: 2 % | Qty: 15

## 2014-04-13 MED FILL — TRAMADOL 50 MG TAB: 50 mg | ORAL | Qty: 1

## 2014-04-13 MED FILL — FAMOTIDINE 20 MG TAB: 20 mg | ORAL | Qty: 1

## 2014-04-13 MED FILL — PROTONIX 40 MG INTRAVENOUS SOLUTION: 40 mg | INTRAVENOUS | Qty: 80

## 2014-04-13 MED FILL — SODIUM CHLORIDE 0.9 % IV: INTRAVENOUS | Qty: 1000

## 2014-04-13 MED FILL — METOCLOPRAMIDE 5 MG/ML IJ SOLN: 5 mg/mL | INTRAMUSCULAR | Qty: 2

## 2014-04-13 NOTE — ED Notes (Signed)
Pt. Reports epigastric abdominal pain x 2 days and left lumbar back pain x one week. No recent injury or heavy lifting per pt.

## 2014-04-13 NOTE — ED Notes (Signed)
Pt resting quietly in bed with eyes open. Pt denies any pain at this time. Pt states, "im ready to go home"

## 2014-04-13 NOTE — ED Provider Notes (Signed)
HPI Comments: Dylan LevansLarry L Sharp is a 53 y.o. Male with Hx of GERD who presents to the ED with complaint of severe epigastric abd and right flank pain for 3 days. Patient states his pain is Sharp and burning and rated as a 10/10 in severity. Patient states his pain is worsened by PO intake and when swallowing. Pain is slightly relieve by rest. Patient states he took Maalox and Zantac in an attempt to relieve his pain but it was not relieved. Patient also reports dysuria. Patient denies vomiting, diarrhea, constipation, fever, chills, rashes, sore throat and cough.         Patient is a 53 y.o. male presenting with abdominal pain and back pain. The history is provided by medical records.   Abdominal Pain   This is a new problem. Episode onset: 3 days. The problem occurs constantly. The problem has not changed since onset.The pain is associated with eating. The pain is located in the epigastric region. The quality of the pain is Sharp and burning. The pain is at a severity of 10/10. The pain is severe. Associated symptoms include dysuria and back pain. Pertinent negatives include no fever, no diarrhea, no nausea, no vomiting, no constipation and no headaches. The pain is worsened by eating. The pain is relieved by nothing.   Back Pain   Associated symptoms include abdominal pain (flank pain and epigastric pain) and dysuria. Pertinent negatives include no fever and no headaches.        Past Medical History   Diagnosis Date   ??? 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 GERD (gastroesophageal reflux disease)    ;     History reviewed. No pertinent past surgical history.      Family History   Problem Relation Age of Onset   ??? Hypertension Mother    ??? Diabetes Mother    ??? Hypertension Father    ??? Diabetes Father    ??? Diabetes Sister    ??? Diabetes Brother         History     Social History   ??? Marital Status: SINGLE     Spouse Name: N/A     Number of Children: N/A   ??? Years of Education: N/A     Occupational History    ??? Not on file.     Social History Main Topics   ??? Smoking status: Light Tobacco Smoker -- 0.25 packs/day for 20 years   ??? Smokeless tobacco: Never Used   ??? Alcohol Use: 2.5 oz/week     5 Shots of liquor per week   ??? Drug Use: No   ??? Sexual Activity: Not Currently     Other Topics Concern   ??? Not on file     Social History Narrative                ALLERGIES: Motrin      Review of Systems   Constitutional: Negative for fever and chills.   HENT: Negative for rhinorrhea and sore throat.    Respiratory: Negative for cough.    Gastrointestinal: Positive for abdominal pain (flank pain and epigastric pain). Negative for nausea, vomiting, diarrhea and constipation.   Genitourinary: Positive for dysuria.   Musculoskeletal: Positive for back pain.   Skin: Negative for rash.   Neurological: Negative for headaches.       Filed Vitals:    04/13/14 1017   BP: 134/81   Pulse: 92   Temp:  98.4 ??F (36.9 ??C)   Resp: 18   Height: 5\' 9"  (1.753 m)   Weight: 78.019 kg (172 lb)   SpO2: 100%            Physical Exam   Constitutional: He is oriented to person, place, and time. He appears well-developed and well-nourished. No distress.   HENT:   Head: Normocephalic and atraumatic.   Eyes: Conjunctivae are normal. Pupils are equal, round, and reactive to light. Right eye exhibits no discharge. Left eye exhibits no discharge. No scleral icterus.   Neck: Normal range of motion. Neck supple.   Cardiovascular: Normal rate, regular rhythm, normal heart sounds and intact distal pulses.  Exam reveals no gallop and no friction rub.    No murmur heard.  Pulmonary/Chest: Effort normal and breath sounds normal. No respiratory distress. He has no wheezes. He has no rales.   Abdominal: Soft. Bowel sounds are normal. He exhibits no distension. There is no tenderness. There is no rebound and no guarding.   Negative flank tenderness to palpation   Musculoskeletal: Normal range of motion. He exhibits no edema or tenderness.    Neurological: He is alert and oriented to person, place, and time.   Skin: Skin is warm and dry. He is not diaphoretic. No erythema.   Psychiatric: He has a normal mood and affect. His behavior is normal. Judgment and thought content normal.   Nursing note and vitals reviewed.       MDM  Number of Diagnoses or Management Options  Abdominal pain, epigastric:   Gastroesophageal reflux disease without esophagitis:   Diagnosis management comments: 53 y.o. male with hx of GERD who presents with symptoms for 3 days that are worse than previous episodes, no resolution using regular regimens of medicines to relieve his symptoms.     DDx:    GERD  Pancreatitis  Gastritis  Peptic ulcer      Plan:     Labs  Fluids  Reassess  -----  10:31 AM  Documented by Florinda Marker, acting as a scribe for Dr. Lawana Chambers, MD      PROVIDER ATTESTATION:  3:35 PM    The entirety of this note, signed by me, accurately reflects all works, treatments, procedures, and medical decision making performed by me, Lawana Chambers, MD.                 Amount and/or Complexity of Data Reviewed  Clinical lab tests: reviewed  Tests in the radiology section of CPT??: reviewed  Tests in the medicine section of CPT??: reviewed    Patient Progress  Patient progress: stable    Diagnostic Studies:    AXR 11:36 AM      Viewed by me     Interpreted by me   No acute disease    Abnormal : see below    Discussed with       Radiologist      Normal bowel gas  No free air   Normal stool amount   Interpretation: No bowel obstruction, no free air, moderate amounts of colonic stool.       Lab Data:    Labs Reviewed   CBC WITH AUTOMATED DIFF - Abnormal; Notable for the following:     WBC 3.5 (*)     MCH 32.0 (*)     LYMPHOCYTES 41 (*)     ABS. MONOCYTES 0.3 (*)     All other components within normal limits   METABOLIC PANEL,  COMPREHENSIVE - Abnormal; Notable for the following:     Chloride 109 (*)     Glucose 110 (*)     All other components within normal limits    LIPASE   URINALYSIS W/ RFLX MICROSCOPIC   DRUG SCREEN, URINE         ED Course:   12:18 PM Pt states medication helped to relieve his symptoms and he is now requesting ice water.     1:55 PM Patient tolerated PO challenge and will be D/C home.         Procedures

## 2014-04-13 NOTE — ED Notes (Signed)
Patient is an occasional drinker, and does not feel he need assistance with stopping.

## 2014-04-13 NOTE — Other (Signed)
Pt was tested for HIV.  Test result is negative.

## 2014-04-13 NOTE — ED Notes (Signed)
Pt discharged with a rx for Pepcid and Reglan. Pt also educated on got to care for diagnosis at home. Pt verbalized understanding. Pt is A&Ox4. Pt able to ambulate unassisted and with steady gait when exiting the ER

## 2014-05-17 ENCOUNTER — Inpatient Hospital Stay
Admit: 2014-05-17 | Discharge: 2014-05-17 | Disposition: A | Discharge status: Home / Self Care | Payer: PRIVATE HEALTH INSURANCE | Attending: Emergency Medicine

## 2014-05-17 DIAGNOSIS — R1013 Epigastric pain: Secondary | ICD-10-CM

## 2014-05-17 LAB — METABOLIC PANEL, COMPREHENSIVE
A-G Ratio: 1.1 (ref 1.0–3.1)
ALT (SGPT): 37 U/L (ref 12.0–78.0)
AST (SGOT): 31 U/L (ref 15–37)
Albumin: 3.6 g/dL (ref 3.40–5.00)
Alk. phosphatase: 74 U/L (ref 46–116)
Anion gap: 10 mmol/L (ref 10–17)
BUN/Creatinine ratio: 9 (ref 6.0–20.0)
BUN: 7 MG/DL (ref 7–18)
Bilirubin, total: 0.3 MG/DL (ref 0.20–1.00)
CO2: 30 mmol/L (ref 21–32)
Calcium: 8.5 MG/DL (ref 8.5–10.1)
Chloride: 102 mmol/L (ref 98–107)
Creatinine: 0.8 MG/DL (ref 0.6–1.3)
GFR est AA: >60 mL/min/{1.73_m2} (ref >60)
GFR est non-AA: >60 mL/min/{1.73_m2} (ref >60)
Globulin: 3.4 g/dL
Glucose: 76 mg/dL (ref 74–106)
Potassium: 3.8 mmol/L (ref 3.50–5.10)
Protein, total: 7 g/dL (ref 6.40–8.20)
Sodium: 138 mmol/L (ref 136–145)

## 2014-05-17 LAB — CBC WITH AUTOMATED DIFF
ABS. BASOPHILS: 0 10*3/uL (ref 0.0–0.2)
ABS. EOSINOPHILS: 0.1 10*3/uL (ref 0.0–0.7)
ABS. LYMPHOCYTES: 1.7 10*3/uL (ref 1.2–3.4)
ABS. MONOCYTES: 0.5 10*3/uL — ABNORMAL LOW (ref 1.1–3.2)
ABS. NEUTROPHILS: 2 10*3/uL (ref 1.4–6.5)
BASOPHILS: 1 % (ref 0–2)
EOSINOPHILS: 2 % (ref 0–5)
HCT: 39.3 % (ref 36.8–45.2)
HGB: 13.4 g/dL (ref 12.8–15.0)
IMMATURE GRANULOCYTES: 0.2 % (ref 0.0–5.0)
LYMPHOCYTES: 40 % (ref 16–40)
MCH: 31.2 PG — ABNORMAL HIGH (ref 27–31)
MCHC: 34.1 g/dL (ref 32–36)
MCV: 91.4 FL (ref 81–99)
MONOCYTES: 11 % (ref 0–12)
MPV: 8.6 FL (ref 7.4–10.4)
NEUTROPHILS: 46 % (ref 40–70)
NRBC: 0 PER 100 WBC
PLATELET: 345 10*3/uL (ref 140–450)
RBC: 4.3 M/uL (ref 4.0–5.2)
RDW: 12.4 % (ref 11.5–14.5)
WBC: 4.3 10*3/uL — ABNORMAL LOW (ref 4.8–10.8)

## 2014-05-17 LAB — LIPASE: Lipase: 74 U/L (ref 65–230)

## 2014-05-17 MED ORDER — ALUM-MAG HYDROXIDE-SIMETH 200 MG-200 MG-20 MG/5 ML ORAL SUSP
200-200-20 mg/5 mL | ORAL | Status: AC
Start: 2014-05-17 — End: 2014-05-17
  Administered 2014-05-17: 17:00:00 via ORAL

## 2014-05-17 MED ORDER — PANTOPRAZOLE 40 MG TAB, DELAYED RELEASE
40 mg | ORAL_TABLET | Freq: Every day | ORAL | Status: AC
Start: 2014-05-17 — End: 2014-06-06

## 2014-05-17 MED ORDER — LIDOCAINE 2 % MUCOSAL SOLN
2 % | Status: AC
Start: 2014-05-17 — End: 2014-05-17
  Administered 2014-05-17: 17:00:00 via OROMUCOSAL

## 2014-05-17 MED ORDER — LIDOCAINE 2 % MUCOSAL SOLN
2 % | Status: AC
Start: 2014-05-17 — End: 2014-05-17
  Administered 2014-05-17: 19:00:00 via OROMUCOSAL

## 2014-05-17 MED ORDER — SODIUM CHLORIDE 0.9 % IJ SYRG
INTRAMUSCULAR | Status: DC | PRN
Start: 2014-05-17 — End: 2014-05-17

## 2014-05-17 MED ORDER — TRAMADOL 50 MG TAB
50 mg | ORAL_TABLET | Freq: Three times a day (TID) | ORAL | Status: DC | PRN
Start: 2014-05-17 — End: 2016-11-20

## 2014-05-17 MED ORDER — MORPHINE 4 MG/ML SYRINGE
4 mg/mL | INTRAMUSCULAR | Status: DC | PRN
Start: 2014-05-17 — End: 2014-05-17

## 2014-05-17 MED ORDER — TRAMADOL 50 MG TAB
50 mg | ORAL | Status: AC
Start: 2014-05-17 — End: 2014-05-17
  Administered 2014-05-17: 19:00:00 via ORAL

## 2014-05-17 MED ORDER — PANTOPRAZOLE 40 MG TAB, DELAYED RELEASE
40 mg | ORAL | Status: AC
Start: 2014-05-17 — End: 2014-05-17
  Administered 2014-05-17: 17:00:00 via ORAL

## 2014-05-17 MED FILL — PANTOPRAZOLE 40 MG TAB, DELAYED RELEASE: 40 mg | ORAL | Qty: 1

## 2014-05-17 MED FILL — TRAMADOL 50 MG TAB: 50 mg | ORAL | Qty: 2

## 2014-05-17 MED FILL — MAG-AL PLUS 200 MG-200 MG-20 MG/5 ML ORAL SUSPENSION: 200-200-20 mg/5 mL | ORAL | Qty: 30

## 2014-05-17 MED FILL — LIDOCAINE VISCOUS 2 % MUCOSAL SOLUTION: 2 % | Qty: 15

## 2014-05-17 NOTE — ED Notes (Signed)
I have reviewed discharge and prescription instructions with the patient.  The patient verbalized understanding.

## 2014-05-17 NOTE — ED Notes (Signed)
Pt. Reports burning "acid reflux" pain x 2 weeks. No nausea or vomiting. Pt. Took Zantac and Maalox approximately 20 minutes ago and says that viscous lidocaine helps his pain when he last came to ER.

## 2014-05-17 NOTE — ED Provider Notes (Addendum)
HPI Comments: Dylan Sharp is a 53 y.o. male with a hx of GERD who presents to the ED with complaint of intermittent, non-radiating, abd pain gradually worsening for the past 2 weeks. Pt says "my stomach is on fire. When I drink water, it burns. When I eat, it burns. Acid comes up..my stomach is swelling and is burning and burning." He denies any alleviated sx for his generalized abd pain with Maalox and Zantac and has not eaten since last night. Pt says he does not drink milk "because it makes my stomach cramp" and has not consumed EtOH since last Friday (2 beers only). He also reports he was D/C from [place] last night for pna. Denies N/V/D, bladder/bowel incontinence, fever, and chills.    Patient is a 53 y.o. male presenting with abdominal pain. The history is provided by the patient.   Abdominal Pain   This is a recurrent problem. The current episode started more than 1 week ago (2 weeks). The problem occurs daily. The problem has been gradually worsening. The pain is associated with eating. The pain is located in the generalized abdominal region. The quality of the pain is burning. Pertinent negatives include no fever, no diarrhea, no nausea, no vomiting, no constipation, no dysuria, no frequency, no hematuria, no chest pain and no back pain. The pain is worsened by eating. The pain is relieved by nothing. His past medical history is significant for GERD.        Past Medical History:   Diagnosis Date   ??? 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 GERD (gastroesophageal reflux disease)       Family History:   Problem Relation Age of Onset   ??? Hypertension Mother    ??? Diabetes Mother    ??? Hypertension Father    ??? Diabetes Father    ??? Diabetes Sister    ??? Diabetes Brother      History     Social History   ??? Marital Status: SINGLE     Spouse Name: N/A     Number of Children: N/A   ??? Years of Education: N/A     Occupational History   ??? Not on file.     Social History Main Topics    ??? Smoking status: Light Tobacco Smoker -- 0.25 packs/day for 20 years   ??? Smokeless tobacco: Never Used   ??? Alcohol Use: 2.5 oz/week     5 Shots of liquor per week   ??? Drug Use: No   ??? Sexual Activity: Not Currently     Other Topics Concern   ??? Not on file     Social History Narrative       ALLERGIES: Motrin      Review of Systems   Constitutional: Positive for appetite change (Decreased ). Negative for fever and chills.   HENT: Negative.  Negative for congestion.    Eyes: Negative.  Negative for visual disturbance.   Respiratory: Negative.  Negative for cough and shortness of breath.    Cardiovascular: Negative.  Negative for chest pain.   Gastrointestinal: Positive for abdominal pain (Generalized ). Negative for nausea, vomiting, diarrhea, constipation and blood in stool.   Genitourinary: Negative.  Negative for dysuria, urgency, frequency and hematuria.   Musculoskeletal: Negative.  Negative for back pain and joint swelling.   Skin: Negative.  Negative for rash.   Neurological: Negative.  Negative for dizziness and light-headedness.   All other systems  reviewed and are negative.      Filed Vitals:    05/17/14 0954   BP: 125/75   Pulse: 92   Temp: 98.1 ??F (36.7 ??C)   Resp: 18   Height: $Remove'5\' 9"'NRebndr$  (1.753 m)   Weight: 78.019 kg (172 lb)   SpO2: 98%            Physical Exam   Constitutional: He is oriented to person, place, and time. He appears well-developed and well-nourished.   HENT:   Head: Normocephalic and atraumatic.   Eyes: Conjunctivae are normal. Pupils are equal, round, and reactive to light.   Neck: Normal range of motion. Neck supple.   Cardiovascular: Normal rate, regular rhythm, normal heart sounds and intact distal pulses.  Exam reveals no gallop.    No murmur heard.  Pulmonary/Chest: Effort normal and breath sounds normal. No respiratory distress. He has no wheezes. He has no rales. He exhibits no tenderness.   Abdominal: Soft. Bowel sounds are normal. He exhibits no distension. There  is no tenderness. There is no rebound and no guarding.   Musculoskeletal: Normal range of motion. He exhibits no edema or tenderness.   Neurological: He is alert and oriented to person, place, and time.   Skin: Skin is warm and dry. No rash noted. No erythema.   Psychiatric: He has a normal mood and affect. His behavior is normal. Judgment and thought content normal.   Nursing note and vitals reviewed.       MDM  Number of Diagnoses or Management Options  Abdominal pain, epigastric:   Bilateral low back pain without sciatica:   Diagnosis management comments: 53 y.o. male with acid reflux, no distress, and no abd tenderness.     Plan:   Treatment for sx relief  Refer to GI  Changes med to PPI  Basic Labs  -----  10:58 AM  Documented by Idelia Salm, acting as a scribe for Dr. Lazarus Salines, MD    PROVIDER ATTESTATION:  1:38 PM    The entirety of this note, signed by me, accurately reflects all works, treatments, procedures, and medical decision making performed by me, Lazarus Salines, MD.              Patient Progress  Patient progress: stable      Lab Data:  Labs Reviewed   CBC WITH AUTOMATED DIFF - Abnormal; Notable for the following:     WBC 4.3 (*)     MCH 31.2 (*)     ABS. MONOCYTES 0.5 (*)     All other components within normal limits   METABOLIC PANEL, COMPREHENSIVE   LIPASE       Recent Results (from the past 8 hour(s))   METABOLIC PANEL, COMPREHENSIVE    Collection Time: 05/17/14 12:00 PM   Result Value Ref Range    Sodium 138 136 - 145 mmol/L    Potassium 3.8 3.50 - 5.10 mmol/L    Chloride 102 98 - 107 mmol/L    CO2 30 21 - 32 mmol/L    Anion gap 10 10 - 17 mmol/L    Glucose 76 74 - 106 mg/dL    BUN 7 7 - 18 MG/DL    Creatinine 0.80 0.6 - 1.3 MG/DL    BUN/Creatinine ratio 9 6.0 - 20.0      GFR est AA >60 >60 ml/min/1.24m2    GFR est non-AA >60 >60 ml/min/1.77m2    Calcium 8.5 8.5 - 10.1 MG/DL  Bilirubin, total PENDING MG/DL    ALT PENDING U/L    AST PENDING U/L    Alk. phosphatase PENDING U/L     Protein, total PENDING g/dL    Albumin PENDING g/dL    Globulin PENDING g/dL    A-G Ratio PENDING     CBC WITH AUTOMATED DIFF    Collection Time: 05/17/14 12:00 PM   Result Value Ref Range    WBC 4.3 (L) 4.8 - 10.8 K/uL    RBC 4.30 4.0 - 5.2 M/uL    HGB 13.4 12.8 - 15.0 g/dL    HCT 39.3 36.8 - 45.2 %    MCV 91.4 81 - 99 FL    MCH 31.2 (H) 27 - 31 PG    MCHC 34.1 32 - 36 g/dL    RDW 12.4 11.5 - 14.5 %    PLATELET 345 140 - 450 K/uL    MPV 8.6 7.4 - 10.4 FL    NEUTROPHILS 46 40 - 70 %    LYMPHOCYTES 40 16 - 40 %    MONOCYTES 11 0 - 12 %    EOSINOPHILS 2 0 - 5 %    BASOPHILS 1 0 - 2 %    ABS. NEUTROPHILS 2.0 1.4 - 6.5 K/UL    ABS. LYMPHOCYTES 1.7 1.2 - 3.4 K/UL    ABS. MONOCYTES 0.5 (L) 1.1 - 3.2 K/UL    ABS. EOSINOPHILS 0.1 0.0 - 0.7 K/UL    ABS. BASOPHILS 0.0 0.0 - 0.2 K/UL    DF AUTOMATED      NRBC 0.0 PER 100 WBC    IMMATURE GRANULOCYTES 0.2 0.0 - 5.0 %         ED Course:   1:38 PM  Patient reported abdominal pain resolved.  He reported pain in lower back and running out of pain medication  We will provide symptom support at this time and medication refill.        Procedures

## 2014-05-17 NOTE — ED Notes (Signed)
Pt medicated per MD Laveda Normanran order.

## 2014-09-11 ENCOUNTER — Inpatient Hospital Stay
Admit: 2014-09-11 | Discharge: 2014-09-11 | Disposition: A | Discharge status: Home / Self Care | Payer: PRIVATE HEALTH INSURANCE | Attending: Emergency Medicine

## 2014-09-11 DIAGNOSIS — K279 Peptic ulcer, site unspecified, unspecified as acute or chronic, without hemorrhage or perforation: Secondary | ICD-10-CM

## 2014-09-11 MED ORDER — ALUM-MAG HYDROXIDE-SIMETH 200 MG-200 MG-20 MG/5 ML ORAL SUSP
200-200-20 mg/5 mL | ORAL | Status: AC
Start: 2014-09-11 — End: 2014-09-11
  Administered 2014-09-11: 16:00:00 via ORAL

## 2014-09-11 MED ORDER — PANTOPRAZOLE 40 MG TAB, DELAYED RELEASE
40 mg | ORAL_TABLET | Freq: Every day | ORAL | Status: AC
Start: 2014-09-11 — End: 2014-10-01

## 2014-09-11 MED ORDER — PANTOPRAZOLE 40 MG TAB, DELAYED RELEASE
40 mg | ORAL | Status: AC
Start: 2014-09-11 — End: 2014-09-11
  Administered 2014-09-11: 16:00:00 via ORAL

## 2014-09-11 MED ORDER — LIDOCAINE 2 % MUCOSAL SOLN
2 % | Status: AC
Start: 2014-09-11 — End: 2014-09-11
  Administered 2014-09-11: 16:00:00 via OROMUCOSAL

## 2014-09-11 MED FILL — MAG-AL PLUS 200 MG-200 MG-20 MG/5 ML ORAL SUSPENSION: 200-200-20 mg/5 mL | ORAL | Qty: 30

## 2014-09-11 MED FILL — LIDOCAINE VISCOUS 2 % MUCOSAL SOLUTION: 2 % | Qty: 15

## 2014-09-11 MED FILL — PANTOPRAZOLE 40 MG TAB, DELAYED RELEASE: 40 mg | ORAL | Qty: 1

## 2014-09-11 NOTE — ED Notes (Signed)
C/o abdominal pain, nausea and feels like burning  in stomach since 5 days

## 2014-09-11 NOTE — ED Provider Notes (Signed)
HPI Comments: Dylan Sharp is a 54 y.o. male with a h/o GERD who presents to the ED with complaint of constant, burning epigastric abdominal pain onset 5 days ago. Pain radiates to chest and is associated with water brash. Worse with laying down, better with sitting up. Zantac has offered no relief (last dose was this am). Pt has been seen multiple times in the past for reflux related abdominal pain; states that his current pain is the same as his normal GERD flares but not improved by zantac. Associated symptoms include nausea, vomiting (last episode yesterday), and loose stools. Denies any fever, chills, URI symptoms, chest pain, sob, or back pain.  Pt has not switched over to PPI as instructed, nor has he followed up with GI.    Patient is a 54 y.o. male presenting with abdominal pain. The history is provided by the patient.   Abdominal Pain   This is a new problem. The current episode started more than 2 days ago. The problem occurs constantly. The problem has not changed since onset.The pain is located in the epigastric region. The quality of the pain is burning. The pain is mild. Associated symptoms include nausea and vomiting. Pertinent negatives include no fever, no diarrhea, no melena, no constipation, no dysuria, no frequency, no hematuria, no headaches, no arthralgias, no myalgias, no chest pain and no back pain. Nothing worsens the pain. The pain is relieved by nothing. His past medical history is significant for GERD.        Past Medical History:   Diagnosis Date   ??? 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 GERD (gastroesophageal reflux disease)        History reviewed. No pertinent past surgical history.      Family History:   Problem Relation Age of Onset   ??? Hypertension Mother    ??? Diabetes Mother    ??? Hypertension Father    ??? Diabetes Father    ??? Diabetes Sister    ??? Diabetes Brother        History     Social History   ??? Marital Status: SINGLE     Spouse Name: N/A    ??? Number of Children: N/A   ??? Years of Education: N/A     Occupational History   ??? Not on file.     Social History Main Topics   ??? Smoking status: Light Tobacco Smoker -- 0.25 packs/day for 20 years   ??? Smokeless tobacco: Never Used   ??? Alcohol Use: 2.5 oz/week     5 Shots of liquor per week   ??? Drug Use: No   ??? Sexual Activity: Not Currently     Other Topics Concern   ??? Not on file     Social History Narrative           ALLERGIES: Motrin      Review of Systems   Constitutional: Negative.  Negative for fever and chills.   HENT: Negative.  Negative for congestion and sore throat.    Respiratory: Negative.  Negative for cough and shortness of breath.    Cardiovascular: Negative.  Negative for chest pain and palpitations.   Gastrointestinal: Positive for nausea, vomiting and abdominal pain. Negative for diarrhea, constipation and melena.   Genitourinary: Negative.  Negative for dysuria, frequency and hematuria.   Musculoskeletal: Negative.  Negative for myalgias, back pain and arthralgias.   Skin: Negative.  Negative for pallor and rash.  Allergic/Immunologic: Negative.  Negative for immunocompromised state.   Neurological: Negative.  Negative for weakness, light-headedness and headaches.   All other systems reviewed and are negative.      Filed Vitals:    09/11/14 1059   BP: 144/90   Pulse: 84   Temp: 98.5 ??F (36.9 ??C)   Resp: 15   Height: 5\' 9"  (1.753 m)   Weight: 78.019 kg (172 lb)   SpO2: 100%            Physical Exam   Constitutional: He is oriented to person, place, and time. He appears well-developed and well-nourished.   HENT:   Head: Normocephalic and atraumatic.   Eyes: Conjunctivae and EOM are normal. Pupils are equal, round, and reactive to light. Right eye exhibits no discharge. Left eye exhibits no discharge. No scleral icterus.   Neck: Normal range of motion. Neck supple.   Cardiovascular: Normal rate, regular rhythm and normal heart sounds.  Exam reveals no gallop and no friction rub.     No murmur heard.  Pulmonary/Chest: Effort normal and breath sounds normal. No respiratory distress. He has no wheezes. He has no rales.   Abdominal: Soft. Bowel sounds are normal. He exhibits no distension. There is no tenderness. There is no rebound and no guarding.   No ttp.   Musculoskeletal: Normal range of motion. He exhibits no edema.   Neurological: He is alert and oriented to person, place, and time.   Skin: Skin is warm and dry. No rash noted. No erythema.   Psychiatric: He has a normal mood and affect. His behavior is normal. Judgment and thought content normal.   Nursing note and vitals reviewed.       MDM  Number of Diagnoses or Management Options  Diagnosis management comments: 54 y.o. male with GERD and possible PUD      Plan:     Gi cocktail  Follow up with GI  Add PPI to zantac  -----  11:27 AM  Documented by Gust Rungolin Bonhag, acting as a scribe for Dr. Robynn PaneEsteban Cleva Camero, MD     PROVIDER ATTESTATION:  11:32 AM    The entirety of this note, signed by me, accurately reflects all works, treatments, procedures, and medical decision making performed by me, Robynn PaneEsteban  Amberlin Utke, MD.              Patient Progress  Patient progress: stable    Diagnostic Studies:    Lab Data:  Labs Reviewed - No data to display      ED Course:     12:14 PM  Symptoms resolved.    Procedures

## 2014-09-11 NOTE — ED Notes (Signed)
Based on Mr. Esters's AUDIT screen test, Mr. Dylan Sharp is not a high risk alcohol user. Mr. Dylan Sharp was orientated on SBIRT and the program's functions.

## 2014-09-11 NOTE — ED Notes (Signed)
I have reviewed discharge instructions with the patient.  The patient verbalized understanding.

## 2014-12-19 ENCOUNTER — Inpatient Hospital Stay
Admit: 2014-12-19 | Discharge: 2014-12-19 | Disposition: A | Discharge status: Home / Self Care | Payer: PRIVATE HEALTH INSURANCE | Attending: Emergency Medicine

## 2014-12-19 DIAGNOSIS — R1013 Epigastric pain: Secondary | ICD-10-CM

## 2014-12-19 LAB — CBC WITH AUTOMATED DIFF
ABS. BASOPHILS: 0 10*3/uL (ref 0.0–0.2)
ABS. EOSINOPHILS: 0.1 10*3/uL (ref 0.0–0.7)
ABS. LYMPHOCYTES: 1.7 10*3/uL (ref 1.2–3.4)
ABS. MONOCYTES: 0.3 10*3/uL — ABNORMAL LOW (ref 1.1–3.2)
ABS. NEUTROPHILS: 1.5 10*3/uL (ref 1.4–6.5)
BASOPHILS: 0 % (ref 0–2)
EOSINOPHILS: 3 % (ref 0–5)
HCT: 42.2 % (ref 36.8–45.2)
HGB: 14.4 g/dL (ref 12.8–15.0)
IMMATURE GRANULOCYTES: 0.3 % (ref 0.0–5.0)
LYMPHOCYTES: 49 % — ABNORMAL HIGH (ref 16–40)
MCH: 31.2 PG — ABNORMAL HIGH (ref 27–31)
MCHC: 34.1 g/dL (ref 32–36)
MCV: 91.5 FL (ref 81–99)
MONOCYTES: 8 % (ref 0–12)
MPV: 9.2 FL (ref 7.4–10.4)
NEUTROPHILS: 40 % (ref 40–70)
PLATELET: 336 10*3/uL (ref 140–450)
RBC: 4.61 M/uL (ref 4.0–5.2)
RDW: 12.4 % (ref 11.5–14.5)
WBC: 3.6 10*3/uL — ABNORMAL LOW (ref 4.8–10.8)

## 2014-12-19 LAB — METABOLIC PANEL, COMPREHENSIVE
A-G Ratio: 0.9 — ABNORMAL LOW (ref 1.0–3.1)
ALT (SGPT): 38 U/L (ref 12.0–78.0)
AST (SGOT): 60 U/L — ABNORMAL HIGH (ref 15–37)
Albumin: 3.4 g/dL (ref 3.40–5.00)
Alk. phosphatase: 75 U/L (ref 46–116)
Anion gap: 12 mmol/L (ref 10–17)
BUN/Creatinine ratio: 30 — ABNORMAL HIGH (ref 6.0–20.0)
BUN: 12 MG/DL (ref 7–18)
Bilirubin, total: 0.5 MG/DL (ref 0.20–1.00)
CO2: 29 mmol/L (ref 21–32)
Calcium: 8.9 MG/DL (ref 8.5–10.1)
Chloride: 100 mmol/L (ref 98–107)
Creatinine: 0.4 MG/DL — ABNORMAL LOW (ref 0.6–1.3)
GFR est AA: >60 mL/min/{1.73_m2} (ref >60)
GFR est non-AA: >60 mL/min/{1.73_m2} (ref >60)
Globulin: 3.8 g/dL
Glucose: 90 mg/dL (ref 74–106)
Potassium: 6.1 mmol/L — ABNORMAL HIGH (ref 3.50–5.10)
Protein, total: 7.2 g/dL (ref 6.40–8.20)
Sodium: 135 mmol/L — ABNORMAL LOW (ref 136–145)

## 2014-12-19 LAB — URINALYSIS W/ RFLX MICROSCOPIC
Bilirubin: NEGATIVE
Blood: NEGATIVE
Glucose: NEGATIVE mg/dL
Ketone: NEGATIVE mg/dL
Leukocyte Esterase: NEGATIVE
Nitrites: NEGATIVE
Protein: NEGATIVE mg/dL
Specific gravity: 1.005
Urobilinogen: 0.2 EU/dL (ref 0.2–1.0)
pH (UA): 7 (ref 5.0–7.0)

## 2014-12-19 MED ORDER — PANTOPRAZOLE 40 MG TAB, DELAYED RELEASE
40 mg | ORAL | Status: AC
Start: 2014-12-19 — End: 2014-12-19
  Administered 2014-12-19: 16:00:00 via ORAL

## 2014-12-19 MED ORDER — OMEPRAZOLE 40 MG CAP, DELAYED RELEASE
40 mg | ORAL_CAPSULE | Freq: Every day | ORAL | Status: DC
Start: 2014-12-19 — End: 2016-04-19

## 2014-12-19 MED ORDER — TRAMADOL 50 MG TAB
50 mg | ORAL_TABLET | Freq: Four times a day (QID) | ORAL | Status: DC | PRN
Start: 2014-12-19 — End: 2016-09-19

## 2014-12-19 MED ORDER — ALUM-MAG HYDROXIDE-SIMETH 200 MG-200 MG-20 MG/5 ML ORAL SUSP
200-200-20 mg/5 mL | ORAL | Status: AC
Start: 2014-12-19 — End: 2014-12-19
  Administered 2014-12-19: 16:00:00 via ORAL

## 2014-12-19 MED ORDER — TRAMADOL 50 MG TAB
50 mg | ORAL | Status: AC
Start: 2014-12-19 — End: 2014-12-19
  Administered 2014-12-19: 17:00:00 via ORAL

## 2014-12-19 MED ORDER — LIDOCAINE 2 % MUCOSAL SOLN
2 % | Status: DC | PRN
Start: 2014-12-19 — End: 2014-12-19
  Administered 2014-12-19: 16:00:00 via OROMUCOSAL

## 2014-12-19 MED ORDER — ESOMEPRAZOLE MAGNESIUM 20 MG CAP, DELAYED RELEASE
20 mg | ORAL_CAPSULE | Freq: Every day | ORAL | Status: DC
Start: 2014-12-19 — End: 2016-04-19

## 2014-12-19 MED FILL — TRAMADOL 50 MG TAB: 50 mg | ORAL | Qty: 1

## 2014-12-19 MED FILL — LIDOCAINE VISCOUS 2 % MUCOSAL SOLUTION: 2 % | Qty: 15

## 2014-12-19 MED FILL — MAG-AL PLUS 200 MG-200 MG-20 MG/5 ML ORAL SUSPENSION: 200-200-20 mg/5 mL | ORAL | Qty: 30

## 2014-12-19 MED FILL — PANTOPRAZOLE 40 MG TAB, DELAYED RELEASE: 40 mg | ORAL | Qty: 1

## 2014-12-19 NOTE — ED Notes (Signed)
I have reviewed discharge instructions with the patient.  The patient verbalized understanding.

## 2014-12-19 NOTE — ED Provider Notes (Signed)
HPI Comments: Dylan Sharp is a 54 y.o. male with a hx of GERD who presents to the ED with complaint of recurrent generalized abdominal pain x 3 days. He c/o intermittent episodes of nausea, which occur shortly after pt eats or drinks. He denies ingestion of any unusual foods, known exposure to sick contacts, injuries, or trauma. He says sx temporarily resolved with Zantac and Mylanta. He denies vomiting, diarrhea, hematemesis, blood in stools, bladder/bowel incontinence, dysuria, hematuria, CP, SOB, HA, lightheadedness, dizziness, fever, or chills.    Per medical records, pt has a hx of frequent visits to the ED for abdominal pain and noncompliance with discharge instructions recommending follow-up with GI. He says current sx are similar to previous episodes of abdominal pain.    Patient is a 54 y.o. male presenting with abdominal pain. The history is provided by the patient and medical records.   Abdominal Pain   This is a recurrent problem. The current episode started more than 2 days ago (3 days). The problem occurs daily. The problem has not changed since onset.The pain is associated with vomiting and eating. The pain is located in the generalized abdominal region. The quality of the pain is cramping. The pain is moderate. Associated symptoms include nausea. Pertinent negatives include no fever, no diarrhea, no vomiting, no dysuria, no hematuria, no headaches, no arthralgias, no myalgias and no chest pain. The pain is worsened by vomiting and eating. The pain is relieved by antacids. His past medical history is significant for GERD.        Past Medical History:   Diagnosis Date   ??? 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 GERD (gastroesophageal reflux disease)      History reviewed. No pertinent past surgical history.      Family History:   Problem Relation Age of Onset   ??? Hypertension Mother    ??? Diabetes Mother    ??? Hypertension Father    ??? Diabetes Father    ??? Diabetes Sister    ??? Diabetes Brother       History     Social History   ??? Marital Status: SINGLE     Spouse Name: N/A   ??? Number of Children: N/A   ??? Years of Education: N/A     Occupational History   ??? Not on file.     Social History Main Topics   ??? Smoking status: Light Tobacco Smoker -- 0.25 packs/day for 20 years   ??? Smokeless tobacco: Never Used   ??? Alcohol Use: 2.5 oz/week     5 Shots of liquor per week   ??? Drug Use: No   ??? Sexual Activity: Not Currently     Other Topics Concern   ??? Not on file     Social History Narrative     ALLERGIES: Motrin    Review of Systems   Constitutional: Negative.  Negative for fever, chills and fatigue.   HENT: Negative.  Negative for congestion.    Respiratory: Negative.  Negative for cough and shortness of breath.    Cardiovascular: Negative.  Negative for chest pain and palpitations.   Gastrointestinal: Positive for nausea and abdominal pain. Negative for vomiting, diarrhea and blood in stool.   Genitourinary: Negative.  Negative for dysuria and hematuria.   Musculoskeletal: Negative.  Negative for myalgias and arthralgias.   Skin: Negative.  Negative for rash.   Neurological: Negative.  Negative for weakness, numbness and headaches.  Hematological: Negative.  Does not bruise/bleed easily.   All other systems reviewed and are negative.      Filed Vitals:    12/19/14 1112 12/19/14 1114 12/19/14 1323   BP:  112/71 104/74   Pulse: 81  72   Temp: 98 ??F (36.7 ??C)     Resp: 16  16   Height:  (1.753 m)     Weight: 78.019 kg (172 lb)     SpO2: 99%  100%            Physical Exam   Constitutional: He is oriented to person, place, and time. He appears well-developed and well-nourished. No distress.   HENT:   Head: Normocephalic and atraumatic.   Eyes: Conjunctivae and EOM are normal. Pupils are equal, round, and reactive to light. Right eye exhibits no discharge. Left eye exhibits no discharge.   Neck: Normal range of motion. Neck supple.   Cardiovascular: Normal rate, regular rhythm and intact distal pulses.   Exam reveals no gallop and no friction rub.    No murmur heard.  Pulmonary/Chest: Effort normal and breath sounds normal. No respiratory distress. He has no wheezes. He has no rales.   Abdominal: Soft. Bowel sounds are normal. He exhibits no distension. There is no tenderness. There is no rebound and no guarding.   No active vomiting noted.   Musculoskeletal: Normal range of motion. He exhibits no edema or tenderness.   Neurological: He is alert and oriented to person, place, and time.   Skin: Skin is warm and dry. No rash noted. He is not diaphoretic. No erythema.   Psychiatric: He has a normal mood and affect. His behavior is normal. Judgment and thought content normal.   Nursing note and vitals reviewed.       MDM  Number of Diagnoses or Management Options  Chronic low back pain without sciatica, unspecified back pain laterality:   Epigastric pain:   History of peptic ulcer:   Diagnosis management comments: 54 y.o. male with epigastric discomofprt and burning appears to be most c/w GERD or PUD     Plan:   URINALYSIS W/ RFLX MICROSCOPIC  lidocaine (XYLOCAINE) 2 % viscous solution 15 mL  alum-mag hydroxide-simeth (MYLANTA) oral suspension 30 mL  pantoprazole (PROTONIX) tablet 40 mg  D/C and F/U with GI and PCP  -----  11:34 AM  Documented by Rubbie Battiest, acting as a scribe for Dr. Lanier Ensign, MD    PROVIDER ATTESTATION:  1:45 AM    The entirety of this note, signed by me, accurately reflects all works, treatments, procedures, and medical decision making performed by me, Lanier Ensign, MD.              Patient Progress  Patient progress: stable        Lab Data:  Labs Reviewed   CBC WITH AUTOMATED DIFF - Abnormal; Notable for the following:     WBC 3.6 (*)     MCH 31.2 (*)     LYMPHOCYTES 49 (*)     ABS. MONOCYTES 0.3 (*)     All other components within normal limits   METABOLIC PANEL, COMPREHENSIVE - Abnormal; Notable for the following:     Sodium 135 (*)     Potassium 6.1 (*)     Creatinine 0.40 (*)      BUN/Creatinine ratio 30 (*)     AST 60 (*)     A-G Ratio 0.9 (*)     All other components  within normal limits   URINALYSIS W/ RFLX MICROSCOPIC         ED Course:       Symptoms improved following GI cocktail. Patient ate in ED without distress. Given 2 week ppi therapy and fu with Dr. Tarri Glennabdi (GI)    Procedures

## 2014-12-19 NOTE — ED Notes (Signed)
Patient complaining of abd pain for 1 week.

## 2016-04-19 ENCOUNTER — Inpatient Hospital Stay
Admit: 2016-04-19 | Discharge: 2016-04-19 | Disposition: A | Discharge status: Home / Self Care | Payer: PRIVATE HEALTH INSURANCE | Attending: Emergency Medicine

## 2016-04-19 DIAGNOSIS — K2970 Gastritis, unspecified, without bleeding: Secondary | ICD-10-CM

## 2016-04-19 LAB — METABOLIC PANEL, COMPREHENSIVE
A-G Ratio: 1 (ref 1.0–3.1)
ALT (SGPT): 23 U/L (ref 12.0–78.0)
AST (SGOT): 36 U/L (ref 15–37)
Albumin: 3.4 g/dL (ref 3.40–5.00)
Alk. phosphatase: 69 U/L (ref 46–116)
Anion gap: 14 mmol/L (ref 10–17)
BUN/Creatinine ratio: 19 (ref 6.0–20.0)
BUN: 10 MG/DL (ref 7–18)
Bilirubin, total: 0.6 MG/DL (ref 0.20–1.00)
CO2: 25 mmol/L (ref 21–32)
Calcium: 9.4 MG/DL (ref 8.5–10.1)
Chloride: 104 mmol/L (ref 98–107)
Creatinine: 0.52 MG/DL — ABNORMAL LOW (ref 0.6–1.3)
GFR est AA: >60 mL/min/{1.73_m2} (ref >60)
GFR est non-AA: >60 mL/min/{1.73_m2} (ref >60)
Globulin: 3.5 g/dL
Glucose: 94 mg/dL (ref 74–106)
Potassium: 5.5 mmol/L — ABNORMAL HIGH (ref 3.50–5.10)
Protein, total: 6.9 g/dL (ref 6.40–8.20)
Sodium: 137 mmol/L (ref 136–145)

## 2016-04-19 LAB — URINALYSIS W/ RFLX MICROSCOPIC
Bilirubin: NEGATIVE
Blood: NEGATIVE
Glucose: NEGATIVE mg/dL
Ketone: NEGATIVE mg/dL
Leukocyte Esterase: NEGATIVE
Nitrites: NEGATIVE
Protein: NEGATIVE mg/dL
Specific gravity: 1.015 (ref 1.003–1.030)
Urobilinogen: 1 EU/dL (ref 0.2–1.0)
pH (UA): 7 (ref 5.0–7.0)

## 2016-04-19 LAB — CBC WITH AUTOMATED DIFF
ABS. BASOPHILS: 0 10*3/uL (ref 0.0–0.2)
ABS. EOSINOPHILS: 0.1 10*3/uL (ref 0.0–0.7)
ABS. LYMPHOCYTES: 1.5 10*3/uL (ref 1.2–3.4)
ABS. MONOCYTES: 0.3 10*3/uL — ABNORMAL LOW (ref 1.1–3.2)
ABS. NEUTROPHILS: 2.1 10*3/uL (ref 1.4–6.5)
BASOPHILS: 1 % (ref 0–2)
EOSINOPHILS: 1 % (ref 0–5)
HCT: 39.7 % (ref 36.8–45.2)
HGB: 13.4 g/dL (ref 12.8–15.0)
IMMATURE GRANULOCYTES: 0 % (ref 0.0–5.0)
LYMPHOCYTES: 38 % (ref 16–40)
MCH: 30.7 PG (ref 27–31)
MCHC: 33.8 g/dL (ref 32–36)
MCV: 90.8 FL (ref 81–99)
MONOCYTES: 7 % (ref 0–12)
MPV: 8.7 FL (ref 7.4–10.4)
NEUTROPHILS: 53 % (ref 40–70)
PLATELET: 290 10*3/uL (ref 140–450)
RBC: 4.37 M/uL (ref 4.0–5.2)
RDW: 12.6 % (ref 11.5–14.5)
WBC: 4 10*3/uL — ABNORMAL LOW (ref 4.8–10.8)

## 2016-04-19 LAB — GLUCOSE, POC: Glucose (POC): 89 mg/dL (ref 74–106)

## 2016-04-19 LAB — LIPASE: Lipase: 88 U/L (ref 65–230)

## 2016-04-19 MED ORDER — LIDOCAINE 2 % MUCOSAL SOLN
2 % | Status: AC
Start: 2016-04-19 — End: 2016-04-19
  Administered 2016-04-19: 16:00:00 via OROMUCOSAL

## 2016-04-19 MED ORDER — ONDANSETRON (PF) 4 MG/2 ML INJECTION
4 mg/2 mL | INTRAMUSCULAR | Status: AC
Start: 2016-04-19 — End: 2016-04-19
  Administered 2016-04-19: 15:00:00 via INTRAVENOUS

## 2016-04-19 MED ORDER — LIDOCAINE 2 % MUCOSAL SOLN
2 % | Status: AC
Start: 2016-04-19 — End: 2016-04-19
  Administered 2016-04-19: 17:00:00 via OROMUCOSAL

## 2016-04-19 MED ORDER — SODIUM CHLORIDE 0.9 % IJ SYRG
INTRAMUSCULAR | Status: DC | PRN
Start: 2016-04-19 — End: 2016-04-19

## 2016-04-19 MED ORDER — SODIUM CHLORIDE 0.9 % INJECTION
20 mg/2 mL | INTRAMUSCULAR | Status: AC
Start: 2016-04-19 — End: 2016-04-19
  Administered 2016-04-19: 15:00:00 via INTRAVENOUS

## 2016-04-19 MED ORDER — FENTANYL CITRATE (PF) 50 MCG/ML IJ SOLN
50 mcg/mL | Freq: Once | INTRAMUSCULAR | Status: AC
Start: 2016-04-19 — End: 2016-04-19
  Administered 2016-04-19: 15:00:00 via INTRAVENOUS

## 2016-04-19 MED ORDER — ALUM-MAG HYDROXIDE-SIMETH 200 MG-200 MG-20 MG/5 ML ORAL SUSP
200-200-20 mg/5 mL | ORAL | Status: AC
Start: 2016-04-19 — End: 2016-04-19
  Administered 2016-04-19: 16:00:00 via ORAL

## 2016-04-19 MED ORDER — OMEPRAZOLE 40 MG CAP, DELAYED RELEASE
40 mg | ORAL_CAPSULE | Freq: Every day | ORAL | 0 refills | Status: DC
Start: 2016-04-19 — End: 2016-11-20

## 2016-04-19 MED ORDER — OMEPRAZOLE 40 MG CAP, DELAYED RELEASE
40 mg | ORAL_CAPSULE | Freq: Every day | ORAL | 0 refills | Status: DC
Start: 2016-04-19 — End: 2016-04-19

## 2016-04-19 MED FILL — ONDANSETRON (PF) 4 MG/2 ML INJECTION: 4 mg/2 mL | INTRAMUSCULAR | Qty: 2

## 2016-04-19 MED FILL — MAG-AL PLUS 200 MG-200 MG-20 MG/5 ML ORAL SUSPENSION: 200-200-20 mg/5 mL | ORAL | Qty: 30

## 2016-04-19 MED FILL — LIDOCAINE 2 % MUCOSAL SOLN: 2 % | Qty: 15

## 2016-04-19 MED FILL — FAMOTIDINE (PF) 20 MG/2 ML IV: 20 mg/2 mL | INTRAVENOUS | Qty: 2

## 2016-04-19 MED FILL — FENTANYL CITRATE (PF) 50 MCG/ML IJ SOLN: 50 mcg/mL | INTRAMUSCULAR | Qty: 2

## 2016-04-19 NOTE — ED Triage Notes (Signed)
C/o Abdominal pain for 4 days. Patient awake and alert. No active vomiting

## 2016-04-19 NOTE — ED Notes (Signed)
Pt tolerated IV insertion by RN. MD at bedside for assessment. Pain med given. No NV at this time.

## 2016-04-19 NOTE — ED Notes (Signed)
Pt up to phone in hall way. Steady gait. No c/o pain.

## 2016-04-19 NOTE — ED Provider Notes (Signed)
HPI Comments: Dylan Sharp is a 55 y.o. male who presents to the ED with complaint of epigastric abdominal pain for 4 days. His pain is described as constant, burning, and radiating to his right side/back. His pain is exacerbated with eating and drinking. There are no alleviating factors. He has not eaten anything in the last 2 days because of his pain. Associated symptoms include dysuria and urinary incontinence for 2 days. Patient reports that his urine "dribbles" down his leg. He also reports that his urine is darker than usual. Patient states "my stomach is on fire". He has had similar symptoms in the past when he was diagnosed with a stomach ulcer. Patient normally drinks alcohol twice a day but reports that he has not had any alcohol in 10 days. He has no fever, chills, HA, chest pain, N/V/D, hematuria, or urinary frequency/urgency.  [facility]    Patient is a 55 y.o. male presenting with abdominal pain. The history is provided by the patient.   Abdominal Pain    This is a new problem. The current episode started more than 2 days ago. The problem occurs constantly. The problem has not changed since onset.The pain is located in the epigastric region. The pain is moderate. Associated symptoms include dysuria and back pain. Pertinent negatives include no fever, no diarrhea, no nausea, no vomiting, no frequency, no hematuria, no headaches, no myalgias and no chest pain. Nothing worsens the pain. The pain is relieved by nothing.        Past Medical History:   Diagnosis Date   ??? GERD (gastroesophageal reflux disease)    ??? Ill-defined condition     tumor on back       History reviewed. No pertinent surgical history.      Family History:   Problem Relation Age of Onset   ??? Hypertension Mother    ??? Diabetes Mother    ??? Hypertension Father    ??? Diabetes Father    ??? Diabetes Sister    ??? Diabetes Brother        Social History     Social History    ??? Marital status: SINGLE     Spouse name: N/A   ??? Number of children: N/A   ??? Years of education: N/A     Occupational History   ??? Not on file.     Social History Main Topics   ??? Smoking status: Light Tobacco Smoker     Packs/day: 0.25     Years: 20.00   ??? Smokeless tobacco: Never Used   ??? Alcohol use 2.5 oz/week     5 Shots of liquor per week   ??? Drug use: Yes     Special: Heroin   ??? Sexual activity: Not Currently     Other Topics Concern   ??? Not on file     Social History Narrative         ALLERGIES: Motrin [ibuprofen]    Review of Systems   Constitutional: Negative.  Negative for chills and fever.   HENT: Negative.  Negative for congestion and rhinorrhea.    Respiratory: Negative.  Negative for cough and shortness of breath.    Cardiovascular: Negative.  Negative for chest pain.   Gastrointestinal: Positive for abdominal pain. Negative for diarrhea, nausea and vomiting.   Genitourinary: Positive for dysuria. Negative for frequency, hematuria and urgency.   Musculoskeletal: Positive for back pain. Negative for myalgias.  Skin: Negative.  Negative for wound.   Allergic/Immunologic: Negative.  Negative for immunocompromised state.   Neurological: Negative.  Negative for weakness and headaches.   All other systems reviewed and are negative.      Vitals:    04/19/16 0829   BP: (!) 143/103   Pulse: 84   Resp: 16   Temp: 98.2 ??F (36.8 ??C)   SpO2: 100%   Weight: 78 kg (172 lb)   Height: 5\' 9"  (1.753 m)            Physical Exam   Constitutional: He is oriented to person, place, and time. He appears well-developed and well-nourished.   HENT:   Head: Normocephalic and atraumatic.   Eyes: Conjunctivae and EOM are normal. Pupils are equal, round, and reactive to light.   Neck: Normal range of motion. Neck supple.   Cardiovascular: Normal rate, regular rhythm, S1 normal, S2 normal, normal heart sounds and intact distal pulses.  Exam reveals no gallop and no friction rub.    No murmur heard.   Pulmonary/Chest: Effort normal and breath sounds normal. No respiratory distress. He has no wheezes. He has no rales.   Abdominal: Soft. Bowel sounds are normal. He exhibits no distension and no mass. There is tenderness (Mild) in the epigastric area. There is no rebound, no guarding and no CVA tenderness.   Musculoskeletal: Normal range of motion.   Neurological: He is alert and oriented to person, place, and time.   Skin: Skin is warm and dry.   Psychiatric: He has a normal mood and affect. His behavior is normal. Judgment and thought content normal.   Nursing note and vitals reviewed.       MDM  Number of Diagnoses or Management Options  Gastritis without bleeding, unspecified chronicity, unspecified gastritis type:   Urinary incontinence, male, stress:   Diagnosis management comments: 55 y.o. male with history of PUD presenting with 4 days of burning epigastric pain. DDx includes peptic ulcer disease, gastritis, pancreatitis. Patient also has dribbling urine and some dysuria. Will evaluate for UTI.     Plan:     UA  Abdominal labs  Antacid and antiemetics   -----  9:53 AM  Documented by Valaria Good, acting as a scribe for Dr. Carmie Kanner, MD    PROVIDER ATTESTATION:  2:25 PM    The entirety of this note, signed by me, accurately reflects all works, treatments, procedures, and medical decision making performed by me, Carmie Kanner, MD.          Patient Progress  Patient progress: stable    ED Course     Diagnostic Studies:    Lab Data:  Labs Reviewed   METABOLIC PANEL, COMPREHENSIVE - Abnormal; Notable for the following:        Result Value    Potassium 5.5 (*)     Creatinine 0.52 (*)     All other components within normal limits   CBC WITH AUTOMATED DIFF - Abnormal; Notable for the following:     WBC 4.0 (*)     ABS. MONOCYTES 0.3 (*)     All other components within normal limits   LIPASE   URINALYSIS W/ RFLX MICROSCOPIC   GLUCOSE, POC     ED Course:     11:57 AM   Patient's UA is normal. I informed the patient of his results and explained to him that he needs to follow up with his PCP. He will call his doctor [facility] to get follow up and further evaluation for prostate as a potential cause of his urinary symptoms and for re-evaluation of his epigastric burning sensation.     Procedures

## 2016-04-19 NOTE — ED Notes (Signed)
Given dc instructions. No new orders. Pt DC.

## 2016-04-19 NOTE — ED Notes (Signed)
Pt c/o stomach ulcers . Pt has hx of stomach ulcers. Pt stated his last drink was ten  Days ago.  No other complaints.

## 2016-04-19 NOTE — ED Notes (Signed)
Pt awaiting DC instructions. No new orders. Pt stated he feels better.

## 2016-04-19 NOTE — ED Notes (Signed)
Bedside shift change report given to RN Ladene Artisterrick (oncoming nurse) by Olive Bassabey (offgoing nurse). Report included the following information SBAR, Kardex, ED Summary, [facility] and Recent Results.

## 2016-09-19 ENCOUNTER — Emergency Department

## 2016-09-19 ENCOUNTER — Emergency Department: Admit: 2016-09-19 | Payer: PRIVATE HEALTH INSURANCE | Primary: Family Medicine

## 2016-09-19 ENCOUNTER — Inpatient Hospital Stay
Admit: 2016-09-19 | Discharge: 2016-09-20 | Disposition: A | Discharge status: Home / Self Care | Payer: PRIVATE HEALTH INSURANCE | Attending: Specialist

## 2016-09-19 DIAGNOSIS — R1010 Upper abdominal pain, unspecified: Secondary | ICD-10-CM

## 2016-09-19 LAB — METABOLIC PANEL, COMPREHENSIVE
A-G Ratio: 1 (ref 1.0–3.1)
ALT (SGPT): 20 U/L (ref 12–78)
AST (SGOT): 20 U/L (ref 15–37)
Albumin: 3.5 g/dL (ref 3.4–5.0)
Alk. phosphatase: 84 U/L (ref 46–116)
Anion gap: 9 mmol/L — ABNORMAL LOW (ref 10–17)
BUN/Creatinine ratio: 13 (ref 6–20)
BUN: 12 MG/DL (ref 7–18)
Bilirubin, total: 0.3 MG/DL (ref 0.2–1.0)
CO2: 33 mmol/L — ABNORMAL HIGH (ref 21–32)
Calcium: 9 MG/DL (ref 8.5–10.1)
Chloride: 103 mmol/L (ref 98–107)
Creatinine: 0.92 MG/DL (ref 0.6–1.3)
GFR est AA: >60 mL/min/{1.73_m2} (ref >60)
GFR est non-AA: >60 mL/min/{1.73_m2} (ref >60)
Globulin: 3.4 g/dL
Glucose: 100 mg/dL (ref 74–106)
Potassium: 3.4 mmol/L — ABNORMAL LOW (ref 3.5–5.1)
Protein, total: 6.9 g/dL (ref 6.4–8.2)
Sodium: 142 mmol/L (ref 136–145)

## 2016-09-19 LAB — CBC WITH AUTOMATED DIFF
ABS. BASOPHILS: 0 10*3/uL (ref 0.0–0.2)
ABS. EOSINOPHILS: 0.1 10*3/uL (ref 0.0–0.7)
ABS. LYMPHOCYTES: 2.1 10*3/uL (ref 1.2–3.4)
ABS. MONOCYTES: 0.3 10*3/uL — ABNORMAL LOW (ref 1.1–3.2)
ABS. NEUTROPHILS: 2.1 10*3/uL (ref 1.4–6.5)
BASOPHILS: 1 % (ref 0–2)
EOSINOPHILS: 1 % (ref 0–5)
HCT: 41.1 % (ref 36.8–45.2)
HGB: 13.2 g/dL (ref 12.8–15.0)
IMMATURE GRANULOCYTES: 0 % (ref 0.0–5.0)
LYMPHOCYTES: 45 % — ABNORMAL HIGH (ref 16–40)
MCH: 29.1 PG (ref 27–31)
MCHC: 32.1 g/dL (ref 32–36)
MCV: 90.7 FL (ref 81–99)
MONOCYTES: 6 % (ref 0–12)
MPV: 8.5 FL (ref 7.4–10.4)
NEUTROPHILS: 47 % (ref 40–70)
PLATELET: 335 10*3/uL (ref 140–450)
RBC: 4.53 M/uL (ref 4.0–5.2)
RDW: 13 % (ref 11.5–14.5)
WBC: 4.6 10*3/uL — ABNORMAL LOW (ref 4.8–10.8)

## 2016-09-19 LAB — LIPASE: Lipase: 134 U/L (ref 65–230)

## 2016-09-19 MED ORDER — IOPAMIDOL 61 % IV SOLN
300 mg iodine /mL (61 %) | Freq: Once | INTRAVENOUS | Status: AC
Start: 2016-09-19 — End: 2016-09-19
  Administered 2016-09-19: 23:00:00 via INTRAVENOUS

## 2016-09-19 MED ORDER — OXYCODONE 5 MG TAB
5 mg | ORAL | Status: AC
Start: 2016-09-19 — End: 2016-09-19
  Administered 2016-09-19: 20:00:00 via ORAL

## 2016-09-19 MED ORDER — SODIUM CHLORIDE 0.9 % IJ SYRG
Freq: Once | INTRAMUSCULAR | Status: AC
Start: 2016-09-19 — End: 2016-09-19
  Administered 2016-09-19: 23:00:00 via INTRAVENOUS

## 2016-09-19 MED ORDER — MORPHINE 2 MG/ML INJECTION
2 mg/mL | INTRAMUSCULAR | Status: DC | PRN
Start: 2016-09-19 — End: 2016-09-19

## 2016-09-19 MED ORDER — MORPHINE 2 MG/ML INJECTION
2 mg/mL | Freq: Once | INTRAMUSCULAR | Status: DC
Start: 2016-09-19 — End: 2016-09-20

## 2016-09-19 MED ORDER — DIATRIZOATE MEGLUMINE & SODIUM 66 %-10 % ORAL SOLN
66-10 % | ORAL | Status: AC
Start: 2016-09-19 — End: 2016-09-19
  Administered 2016-09-19: 22:00:00 via ORAL

## 2016-09-19 MED ORDER — SODIUM CHLORIDE 0.9 % INJECTION
40 mg | INTRAMUSCULAR | Status: AC
Start: 2016-09-19 — End: 2016-09-19
  Administered 2016-09-19: 18:00:00 via INTRAVENOUS

## 2016-09-19 MED ORDER — ONDANSETRON (PF) 4 MG/2 ML INJECTION
4 mg/2 mL | INTRAMUSCULAR | Status: AC
Start: 2016-09-19 — End: 2016-09-19
  Administered 2016-09-19: 18:00:00 via INTRAVENOUS

## 2016-09-19 MED ORDER — LIDOCAINE 2 % MUCOSAL SOLN
2 % | Status: AC
Start: 2016-09-19 — End: 2016-09-19
  Administered 2016-09-19: 18:00:00 via OROMUCOSAL

## 2016-09-19 MED ORDER — SODIUM CHLORIDE 0.9% BOLUS IV
0.9 % | INTRAVENOUS | Status: AC
Start: 2016-09-19 — End: 2016-09-19
  Administered 2016-09-19: 18:00:00 via INTRAVENOUS

## 2016-09-19 MED ORDER — ALUM-MAG HYDROXIDE-SIMETH 200 MG-200 MG-20 MG/5 ML ORAL SUSP
200-200-20 mg/5 mL | ORAL | Status: AC
Start: 2016-09-19 — End: 2016-09-19
  Administered 2016-09-19: 18:00:00 via ORAL

## 2016-09-19 MED ORDER — MORPHINE 10 MG/ML INJ SOLUTION
10 mg/ml | INTRAMUSCULAR | Status: AC | PRN
Start: 2016-09-19 — End: 2016-09-19
  Administered 2016-09-19 (×2): via INTRAVENOUS

## 2016-09-19 MED FILL — MORPHINE 4 MG/ML INTRAVENOUS SOLUTION: 4 mg/mL | INTRAVENOUS | Qty: 1

## 2016-09-19 MED FILL — MORPHINE 10 MG/ML SYRINGE: 10 mg/mL | INTRAMUSCULAR | Qty: 1

## 2016-09-19 MED FILL — ISOVUE-300  61 % INTRAVENOUS SOLUTION: 300 mg iodine /mL (61 %) | INTRAVENOUS | Qty: 100

## 2016-09-19 MED FILL — ONDANSETRON (PF) 4 MG/2 ML INJECTION: 4 mg/2 mL | INTRAMUSCULAR | Qty: 2

## 2016-09-19 MED FILL — LIDOCAINE 2 % MUCOSAL SOLN: 2 % | Qty: 15

## 2016-09-19 MED FILL — SODIUM CHLORIDE 0.9 % IV: INTRAVENOUS | Qty: 1000

## 2016-09-19 MED FILL — BD POSIFLUSH NORMAL SALINE 0.9 % INJECTION SYRINGE: INTRAMUSCULAR | Qty: 10

## 2016-09-19 MED FILL — MAG-AL PLUS 200 MG-200 MG-20 MG/5 ML ORAL SUSPENSION: 200-200-20 mg/5 mL | ORAL | Qty: 30

## 2016-09-19 MED FILL — OXYCODONE 5 MG TAB: 5 mg | ORAL | Qty: 1

## 2016-09-19 MED FILL — PROTONIX 40 MG INTRAVENOUS SOLUTION: 40 mg | INTRAVENOUS | Qty: 80

## 2016-09-19 MED FILL — MD-GASTROVIEW 66 %-10 % ORAL SOLUTION: 66-10 % | ORAL | Qty: 30

## 2016-09-19 NOTE — ED Notes (Signed)
Unable to access morphine from the pyxis. Called pharmacy for assistance.

## 2016-09-19 NOTE — ED Notes (Signed)
No morphine available in the pyxis. Called pharmacy to confirm.

## 2016-09-19 NOTE — ED Notes (Signed)
Called pharmacy to change the order concentration for morphine dose.

## 2016-09-19 NOTE — ED Notes (Signed)
Wasted 6 mg of morphine with Arsenio Loader RN.
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
## 2016-09-19 NOTE — ED Notes (Signed)
Pt OTF to CT scan

## 2016-09-19 NOTE — ED Triage Notes (Signed)
Patient arrived in ED complaining of abdominal pain x4 days. Patient reports the pain is worse when food is consumed. Patient reported 6 episodes of vomiting. Patient denies having diarrhea. Patient describes pain as a burning sensation and rates pain 10/10.

## 2016-09-19 NOTE — ED Notes (Signed)
Pt returned from CT at this time

## 2016-09-19 NOTE — ED Notes (Signed)
Called CT to let the tech know PO contrast was consumed at 1730. Pt is ready for CT at this time.

## 2016-09-19 NOTE — ED Notes (Signed)
Bedside shift change report given to Sue Sargent RN (oncoming nurse) by ET (offgoing nurse). Report included the following information SBAR, ED Summary, MAR and Recent Results.

## 2016-09-19 NOTE — ED Notes (Signed)
Pt given instructions for discharge and verbalized understanding. Pt ambulated to d/c area with steady gait and no assist.

## 2016-09-20 MED ORDER — FAMOTIDINE 20 MG TAB
20 mg | ORAL_TABLET | Freq: Two times a day (BID) | ORAL | 0 refills | Status: AC
Start: 2016-09-20 — End: 2016-10-19

## 2016-09-20 MED ORDER — OXYCODONE 5 MG TAB
5 mg | ORAL_TABLET | Freq: Three times a day (TID) | ORAL | 0 refills | Status: DC | PRN
Start: 2016-09-20 — End: 2016-11-20

## 2016-09-20 MED ORDER — ONDANSETRON 4 MG TAB, RAPID DISSOLVE
4 mg | ORAL_TABLET | Freq: Three times a day (TID) | ORAL | 0 refills | Status: DC | PRN
Start: 2016-09-20 — End: 2016-11-20

## 2016-11-20 ENCOUNTER — Inpatient Hospital Stay
Admit: 2016-11-20 | Discharge: 2016-11-20 | Disposition: A | Discharge status: Home / Self Care | Payer: PRIVATE HEALTH INSURANCE | Attending: Emergency Medicine

## 2016-11-20 DIAGNOSIS — S2232XA Fracture of one rib, left side, initial encounter for closed fracture: Secondary | ICD-10-CM

## 2016-11-20 MED ORDER — LIDOCAINE 4 % TOPICAL PATCH (12 HOUR DURATION)
4 % | CUTANEOUS | Status: DC
Start: 2016-11-20 — End: 2016-11-20

## 2016-11-20 MED ORDER — LIDOCAINE 5 % (700 MG/PATCH) ADHESIVE PATCH
5 % | CUTANEOUS | 0 refills | Status: AC
Start: 2016-11-20 — End: ?

## 2016-11-20 MED ORDER — ACETAMINOPHEN 500 MG TAB
500 mg | ORAL | Status: DC
Start: 2016-11-20 — End: 2016-11-20

## 2016-11-20 MED FILL — ASPERCREME (LIDOCAINE) 4 % TOPICAL PATCH: 4 % | CUTANEOUS | Qty: 1

## 2016-11-20 MED FILL — MAPAP EXTRA STRENGTH 500 MG TABLET: 500 mg | ORAL | Qty: 2
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
## 2016-11-20 NOTE — ED Notes (Signed)
Pt given discharge instructions. Pt will follow up with PCP. Pt in no distress and verbalized understanding of d/c instructions. Pt d/c'ed to home. Walked out with steady gait. Pt reports pain is improved.
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
## 2017-05-16 ENCOUNTER — Inpatient Hospital Stay: Admit: 2017-05-16 | Discharge: 2017-05-16 | Payer: PRIVATE HEALTH INSURANCE | Attending: Emergency Medicine

## 2017-05-16 ENCOUNTER — Emergency Department: Admit: 2017-05-16 | Payer: PRIVATE HEALTH INSURANCE | Primary: Family Medicine

## 2017-05-16 DIAGNOSIS — R1013 Epigastric pain: Secondary | ICD-10-CM

## 2017-05-16 LAB — CBC WITH AUTOMATED DIFF
ABS. BASOPHILS: 0 10*3/uL (ref 0.0–0.2)
ABS. EOSINOPHILS: 0.1 10*3/uL (ref 0.0–0.7)
ABS. LYMPHOCYTES: 2.1 10*3/uL (ref 1.2–3.4)
ABS. MONOCYTES: 0.3 10*3/uL — ABNORMAL LOW (ref 1.1–3.2)
ABS. NEUTROPHILS: 2.9 10*3/uL (ref 1.4–6.5)
BASOPHILS: 1 % (ref 0–2)
EOSINOPHILS: 1 % (ref 0–5)
HCT: 35 % — ABNORMAL LOW (ref 36.8–45.2)
HGB: 11.2 g/dL — ABNORMAL LOW (ref 12.8–15.0)
IMMATURE GRANULOCYTES: 0 % (ref 0.0–5.0)
LYMPHOCYTES: 40 % (ref 16–40)
MCH: 29.1 PG (ref 27–31)
MCHC: 32 g/dL (ref 32–36)
MCV: 90.9 FL (ref 81–99)
MONOCYTES: 5 % (ref 0–12)
MPV: 8.8 FL (ref 7.4–10.4)
NEUTROPHILS: 53 % (ref 40–70)
PLATELET: 338 10*3/uL (ref 140–450)
RBC: 3.85 M/uL — ABNORMAL LOW (ref 4.0–5.2)
RDW: 13.4 % (ref 11.5–14.5)
WBC: 5.4 10*3/uL (ref 4.8–10.8)

## 2017-05-16 LAB — METABOLIC PANEL, COMPREHENSIVE
A-G Ratio: 0.9 — ABNORMAL LOW (ref 1.0–3.1)
ALT (SGPT): 19 U/L (ref 12–78)
AST (SGOT): 23 U/L (ref 15–37)
Albumin: 3 g/dL — ABNORMAL LOW (ref 3.4–5.0)
Alk. phosphatase: 82 U/L (ref 46–116)
Anion gap: 13 mmol/L (ref 10–17)
BUN/Creatinine ratio: 15 (ref 6–20)
BUN: 12 MG/DL (ref 7–18)
Bilirubin, total: 0.2 MG/DL (ref 0.2–1.0)
CO2: 29 mmol/L (ref 21–32)
Calcium: 8.5 MG/DL (ref 8.5–10.1)
Chloride: 103 mmol/L (ref 98–107)
Creatinine: 0.78 MG/DL (ref 0.6–1.3)
GFR est AA: >60 mL/min/{1.73_m2} (ref >60)
GFR est non-AA: >60 mL/min/{1.73_m2} (ref >60)
Globulin: 3.3 g/dL
Glucose: 111 mg/dL — ABNORMAL HIGH (ref 74–106)
Potassium: 3.4 mmol/L — ABNORMAL LOW (ref 3.5–5.1)
Protein, total: 6.3 g/dL — ABNORMAL LOW (ref 6.4–8.2)
Sodium: 142 mmol/L (ref 136–145)

## 2017-05-16 LAB — URINALYSIS W/ RFLX MICROSCOPIC
Bilirubin: NEGATIVE
Glucose: NEGATIVE mg/dL
Ketone: NEGATIVE mg/dL
Leukocyte Esterase: NEGATIVE
Nitrites: NEGATIVE
Specific gravity: 1.03
Urobilinogen: 0.2 EU/dL (ref 0.2–1.0)
pH (UA): 5.5 (ref 5.0–7.0)

## 2017-05-16 LAB — URINE MICROSCOPIC
Casts: NONE SEEN /lpf
Crystals, urine: NONE SEEN /LPF
Epithelial cells: 3 /hpf
WBC: 1 /hpf

## 2017-05-16 LAB — LIPASE: Lipase: 72 U/L (ref 65–230)

## 2017-05-16 MED ORDER — KETOROLAC TROMETHAMINE 30 MG/ML INJECTION
30 mg/mL (1 mL) | INTRAMUSCULAR | Status: AC
Start: 2017-05-16 — End: 2017-05-16
  Administered 2017-05-16: 21:00:00 via INTRAVENOUS

## 2017-05-16 MED ORDER — ALUM-MAG HYDROXIDE-SIMETH 200 MG-200 MG-20 MG/5 ML ORAL SUSP
200-200-20 mg/5 mL | ORAL | Status: AC
Start: 2017-05-16 — End: 2017-05-16
  Administered 2017-05-16: 21:00:00 via ORAL

## 2017-05-16 MED ORDER — SODIUM CHLORIDE 0.9 % IJ SYRG
INTRAMUSCULAR | Status: DC | PRN
Start: 2017-05-16 — End: 2017-05-16
  Administered 2017-05-16: 21:00:00 via INTRAVENOUS

## 2017-05-16 MED ORDER — LIDOCAINE 2 % MUCOSAL SOLN
2 % | Status: AC
Start: 2017-05-16 — End: 2017-05-16
  Administered 2017-05-16: 21:00:00 via OROMUCOSAL

## 2017-05-16 MED FILL — LIDOCAINE 2 % MUCOSAL SOLN: 2 % | Qty: 15

## 2017-05-16 MED FILL — KETOROLAC TROMETHAMINE 30 MG/ML INJECTION: 30 mg/mL (1 mL) | INTRAMUSCULAR | Qty: 1

## 2017-05-16 MED FILL — MAG-AL PLUS 200 MG-200 MG-20 MG/5 ML ORAL SUSPENSION: 200-200-20 mg/5 mL | ORAL | Qty: 30

## 2017-05-16 NOTE — ED Notes (Signed)
Patient sitting on stretcher and no distress

## 2017-05-16 NOTE — ED Triage Notes (Signed)
Pt with multiple c/o abd pain/numbness of hands/R sided pain. He drinks everyday 1/2 pint liquor and took zantac with mild relief. He said both hands are swollen and painful.

## 2017-05-16 NOTE — ED Notes (Signed)
Patient still not in room

## 2017-05-16 NOTE — ED Notes (Signed)
Patient not in room. Notified MD

## 2017-05-16 NOTE — ED Notes (Signed)
Patient pulled out his iv. Awaiting for labs and patient  Wants to eat. Notified MD. Called to lab about CMP result

## 2017-05-16 NOTE — ED Notes (Signed)
Patient not in room

## 2017-05-16 NOTE — ED Provider Notes (Signed)
56 y/o M w/PMH of ETOH abuse, GERD presents with bilateral hand and foot numbness and burning x 2 weeks as well as right sided flank pain x 1 week. He reports he has decreased sensation in his bilateral hands as well as his feet but he denies any weakness or headache. No neck pain/back pain. He reports he fell on his hip about a week ago, but his flank pain did not start until a few days later and it is in a different spot then where he fell. He reports pain in his abdomen when he voids, color of urine is dark and he noticed blood in it a few days ago. No nausea, vomiting, fevers, penile or scrotal complaints. 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              Past Medical History:   Diagnosis Date   ??? GERD (gastroesophageal reflux disease)    ??? Ill-defined condition     tumor on back       History reviewed. No pertinent surgical history.      Family History:   Problem Relation Age of Onset   ??? Hypertension Mother    ??? Diabetes Mother    ??? Hypertension Father    ??? Diabetes Father    ??? Diabetes Sister    ??? Diabetes Brother        Social History     Socioeconomic History   ??? Marital status: SINGLE     Spouse name: Not on file   ??? Number of children: Not on file   ??? Years of education: Not on file   ??? Highest education level: Not on file   Social Needs   ??? Financial resource strain: Not on file   ??? Food insecurity - worry: Not on file   ??? Food insecurity - inability: Not on file   ??? Transportation needs - medical: Not on file   ??? Transportation needs - non-medical: Not on file   Occupational History   ??? Not on file   Tobacco Use   ??? Smoking status: Light Tobacco Smoker     Packs/day: 0.25     Years: 20.00     Pack years: 5.00   ??? Smokeless tobacco: Never Used   Substance and Sexual Activity   ??? Alcohol use: Yes     Comment: 1/2 pint liquor   ??? Drug use: Yes     Comment:  used marijuana 6-8 to days ago    ??? Sexual activity: Not on file   Other Topics Concern   ??? Not on file   Social History Narrative   ??? Not on file         ALLERGIES: Motrin [ibuprofen]    Review of Systems   Constitutional: Negative for chills and fever.   HENT: Negative for rhinorrhea and sore throat.    Eyes: Negative for pain and visual disturbance.   Respiratory: Negative for cough and shortness of breath.    Cardiovascular: Negative for chest pain and palpitations.   Gastrointestinal: Positive for abdominal pain. Negative for constipation, diarrhea, nausea and vomiting.   Genitourinary: Negative for dysuria, frequency and hematuria.   Musculoskeletal: Negative for back pain and joint swelling.   Skin: Negative for rash and wound.   Neurological: Positive for numbness. Negative for syncope, weakness, light-headedness and headaches.  Vitals:    05/16/17 1426 05/16/17 1551 05/16/17 1740   BP: 122/68 127/81 124/79   Pulse: 86 78 82   Resp: 16 15 17    Temp: 98.1 ??F (36.7 ??C)  98 ??F (36.7 ??C)   SpO2: 97% 98% 100%   Weight: 79.4 kg (175 lb)     Height: 5\' 9"  (1.753 m)              Physical Exam   Constitutional: He is oriented to person, place, and time. He appears well-developed and well-nourished.   HENT:   Head: Normocephalic and atraumatic.   Neck: Normal range of motion. Neck supple.   Cardiovascular: Normal rate, regular rhythm and intact distal pulses. Exam reveals no gallop and no friction rub.   No murmur heard.  Pulmonary/Chest: Effort normal and breath sounds normal. No respiratory distress. He has no wheezes. He has no rales.   Abdominal: Soft. Bowel sounds are normal. He exhibits no distension and no mass. There is no tenderness. There is no rebound and no guarding.   Musculoskeletal: Normal range of motion. He exhibits no edema.   Bilateral arms without swelling, tenderness, nl capp refill, nl pulses.     Right hip- no swelling or erythema, FROM, tenderness over spina iliaca    Neurological: He is alert and oriented to person, place, and time. No cranial nerve deficit.   No facial asymmetry  Moving all 4 extremities symmetrically  No pronator drift  Nl finger tip to nose test, nl gait  Decreased sensation in bilateral palms of the hands, otherwise normal sensation. Patient refused to take his socks off stating "I do not like taking them off" (patient was explained of risks of incomplete evaluation, including limb and life threatening condition that could potentially go undiagnosed due to him being uncooperative, to which he expressed understanding, patient currently A&Ox3, clinically sober, competent to make his own decisions)   Skin: Skin is warm and dry.   intact   Psychiatric: He has a normal mood and affect.        MDM  Number of Diagnoses or Management Options  Diagnosis management comments: 56 y/o M w/PMH of ETOH abuse, GERD presents with bilateral hand and foot numbness and burning x 2 weeks that is most consistent with alcoholic neuropathy. Evaluation limited due to patient uncooperative with exam. Also reports right sided flank pain that is concerning for hip fracture, kidney contusion or nephrolithiasis.       Plan:  CBC, CMP, lipase  UA  XR right hip         Diagnostic Studies:  XRAY 5:49 PM    []  Left []  Right    []  Fingers  []  Hand  []  Wrist  []  Forearm  []  Elbow  []  Humerus  []  Shoulder    []  Toes  []  Foot  []  Ankle  []  Tib/fib  []  Knee  []  Femur  [x]  Hip  []  Pelvis   []  Cervical Spine  []  Thoracic Spine  []  Lumbar Spine  []  Sacrum  []  Ribs        [x]  Viewed by me    []  Interpreted by me  [x]  No fracture/dislocation  []  Abnormal : see below   []  Discussed with       Radiologist        Interpretation: Negative examination. No evidence of fracture or dislocation of the pelvis or right hip.    Lab Data:  Labs Reviewed   METABOLIC PANEL, COMPREHENSIVE -  Abnormal; Notable for the following components:       Result Value    Potassium 3.4 (*)     Glucose 111 (*)      Protein, total 6.3 (*)     Albumin 3.0 (*)     A-G Ratio 0.9 (*)     All other components within normal limits   CBC WITH AUTOMATED DIFF - Abnormal; Notable for the following components:    RBC 3.85 (*)     HGB 11.2 (*)     HCT 35.0 (*)     ABS. MONOCYTES 0.3 (*)     All other components within normal limits   URINALYSIS W/ RFLX MICROSCOPIC - Abnormal; Notable for the following components:    Protein TRACE (*)     Blood LARGE (*)     All other components within normal limits   LIPASE   URINE MICROSCOPIC         ED Course:    Procedures    4:30 PM  Patient states he is "ready to go". Explained we are still waiting on UA and lab results, he states he will wait for results.      5:15 PM  Patient pulled out his own IV. CMP not results yet but large blood in urine. Awaiting CMP.     6:30 PM  Pt was advised to stay for a CT scan of his abd to r/o kidney stones vs renal injury, and he was agreeable to plan and to get a new line. However, pt was not found in his room and has not been back to his room since last evaluation.     The patient eloped prior to completing his workup. I was unable to discuss with him his discharge instructions, possible diagnoses, and the risks of leaving prior to discovering the likely cause of his symptoms. Prior to eloping, the patient had a normal mental status and was competent to make his own medical decisions.

## 2017-05-16 NOTE — ED Notes (Signed)
Patient states he has numbness on bilateral arms for 2 weeks and c/o pain on urination and abdominl pain. No vomiting, No distress noted. Resting on bed

## 2017-05-16 NOTE — ED Notes (Signed)
Reviewed by MD and advised cT with contrast and iv saline lock and asked patient to change to gown

## 2017-05-17 NOTE — Telephone Encounter (Signed)
Called pt for follow up. Message was left.

## 2017-07-05 ENCOUNTER — Emergency Department

## 2017-07-05 ENCOUNTER — Inpatient Hospital Stay
Admit: 2017-07-05 | Discharge: 2017-07-05 | Disposition: A | Discharge status: Home / Self Care | Payer: PRIVATE HEALTH INSURANCE | Attending: Emergency Medicine

## 2017-07-05 ENCOUNTER — Emergency Department: Admit: 2017-07-05 | Payer: PRIVATE HEALTH INSURANCE | Primary: Family Medicine

## 2017-07-05 DIAGNOSIS — K5900 Constipation, unspecified: Secondary | ICD-10-CM

## 2017-07-05 LAB — CBC WITH AUTOMATED DIFF
ABS. BASOPHILS: 0 10*3/uL (ref 0.0–0.2)
ABS. EOSINOPHILS: 0.1 10*3/uL (ref 0.0–0.7)
ABS. LYMPHOCYTES: 1.4 10*3/uL (ref 1.2–3.4)
ABS. MONOCYTES: 0.3 10*3/uL — ABNORMAL LOW (ref 1.1–3.2)
ABS. NEUTROPHILS: 4.1 10*3/uL (ref 1.4–6.5)
BASOPHILS: 1 % (ref 0–2)
EOSINOPHILS: 2 % (ref 0–5)
HCT: 36.8 % (ref 36.8–45.2)
HGB: 11.5 g/dL — ABNORMAL LOW (ref 12.8–15.0)
IMMATURE GRANULOCYTES: 1 % (ref 0.0–5.0)
LYMPHOCYTES: 24 % (ref 16–40)
MCH: 28.7 PG (ref 27–31)
MCHC: 31.3 g/dL — ABNORMAL LOW (ref 32–36)
MCV: 91.8 FL (ref 81–99)
MONOCYTES: 5 % (ref 0–12)
MPV: 8.2 FL (ref 7.4–10.4)
NEUTROPHILS: 68 % (ref 40–70)
PLATELET: 419 10*3/uL (ref 140–450)
RBC: 4.01 M/uL (ref 4.0–5.2)
RDW: 13.9 % (ref 11.5–14.5)
WBC: 6.1 10*3/uL (ref 4.8–10.8)

## 2017-07-05 LAB — METABOLIC PANEL, COMPREHENSIVE
A-G Ratio: 0.9 — ABNORMAL LOW (ref 1.0–3.1)
ALT (SGPT): 28 U/L (ref 12–78)
AST (SGOT): 21 U/L (ref 15–37)
Albumin: 3.2 g/dL — ABNORMAL LOW (ref 3.4–5.0)
Alk. phosphatase: 89 U/L (ref 46–116)
Anion gap: 13 mmol/L (ref 10–17)
BUN/Creatinine ratio: 19 (ref 6.0–20.0)
BUN: 16 MG/DL (ref 7–18)
Bilirubin, total: 0.1 MG/DL — ABNORMAL LOW (ref 0.2–1.0)
CO2: 31 mmol/L (ref 21–32)
Calcium: 9.3 MG/DL (ref 8.5–10.1)
Chloride: 102 mmol/L (ref 98–107)
Creatinine: 0.85 MG/DL (ref 0.6–1.3)
GFR est AA: >60 mL/min/{1.73_m2} (ref >60)
GFR est non-AA: >60 mL/min/{1.73_m2} (ref >60)
Globulin: 3.5 g/dL
Glucose: 92 mg/dL (ref 74–106)
Potassium: 4.6 mmol/L (ref 3.5–5.1)
Protein, total: 6.7 g/dL (ref 6.4–8.2)
Sodium: 141 mmol/L (ref 136–145)

## 2017-07-05 LAB — LIPASE: Lipase: 59 U/L — ABNORMAL LOW (ref 65–230)

## 2017-07-05 MED ORDER — MORPHINE 4 MG/ML INTRAVENOUS SOLUTION
4 mg/mL | INTRAVENOUS | Status: AC
Start: 2017-07-05 — End: 2017-07-05
  Administered 2017-07-05: 16:00:00 via INTRAVENOUS

## 2017-07-05 MED ORDER — SODIUM CHLORIDE 0.9% BOLUS IV
0.9 % | Freq: Once | INTRAVENOUS | Status: AC
Start: 2017-07-05 — End: 2017-07-05
  Administered 2017-07-05: 16:00:00 via INTRAVENOUS

## 2017-07-05 MED ORDER — MORPHINE 4 MG/ML INTRAVENOUS SOLUTION
4 mg/mL | INTRAVENOUS | Status: DC | PRN
Start: 2017-07-05 — End: 2017-07-05
  Administered 2017-07-05: 22:00:00 via INTRAVENOUS

## 2017-07-05 MED ORDER — DIATRIZOATE MEGLUMINE & SODIUM 66 %-10 % ORAL SOLN
66-10 % | ORAL | Status: AC
Start: 2017-07-05 — End: 2017-07-05
  Administered 2017-07-05: 15:00:00 via ORAL

## 2017-07-05 MED ORDER — LACTULOSE 10 GRAM/15 ML ORAL SOLN
10 gram/15 mL | Freq: Two times a day (BID) | ORAL | 0 refills | Status: AC
Start: 2017-07-05 — End: 2017-07-13

## 2017-07-05 MED ORDER — MORPHINE 4 MG/ML SYRINGE
4 mg/mL | INTRAMUSCULAR | Status: DC | PRN
Start: 2017-07-05 — End: 2017-07-05

## 2017-07-05 MED ORDER — SODIUM CHLORIDE 0.9 % IJ SYRG
INTRAMUSCULAR | Status: DC | PRN
Start: 2017-07-05 — End: 2017-07-05

## 2017-07-05 MED ORDER — IOPAMIDOL 61 % IV SOLN
300 mg iodine /mL (61 %) | Freq: Once | INTRAVENOUS | Status: AC
Start: 2017-07-05 — End: 2017-07-05
  Administered 2017-07-05: 17:00:00 via INTRAVENOUS

## 2017-07-05 MED ORDER — SODIUM CHLORIDE 0.9 % IJ SYRG
Freq: Once | INTRAMUSCULAR | Status: AC
Start: 2017-07-05 — End: 2017-07-05
  Administered 2017-07-05: 17:00:00 via INTRAVENOUS

## 2017-07-05 MED ORDER — ALUM-MAG HYDROXIDE-SIMETH 200 MG-200 MG-20 MG/5 ML ORAL SUSP
200-200-20 mg/5 mL | ORAL | Status: AC
Start: 2017-07-05 — End: 2017-07-05
  Administered 2017-07-05: 16:00:00 via ORAL

## 2017-07-05 MED ORDER — LIDOCAINE 2 % MUCOSAL SOLN
2 % | Status: AC
Start: 2017-07-05 — End: 2017-07-05
  Administered 2017-07-05: 15:00:00 via OROMUCOSAL

## 2017-07-05 MED ORDER — TRAZODONE 50 MG TAB
50 mg | ORAL | Status: AC
Start: 2017-07-05 — End: 2017-07-05
  Administered 2017-07-05: 19:00:00 via ORAL

## 2017-07-05 MED FILL — MORPHINE 4 MG/ML INTRAVENOUS SOLUTION: 4 mg/mL | INTRAVENOUS | Qty: 1

## 2017-07-05 MED FILL — SODIUM CHLORIDE 0.9 % IV: INTRAVENOUS | Qty: 1000

## 2017-07-05 MED FILL — ISOVUE-300  61 % INTRAVENOUS SOLUTION: 300 mg iodine /mL (61 %) | INTRAVENOUS | Qty: 100

## 2017-07-05 MED FILL — MORPHINE 4 MG/ML INTRAVENOUS SOLUTION: 4 mg/mL | INTRAVENOUS | Qty: 2

## 2017-07-05 MED FILL — LIDOCAINE 2 % MUCOSAL SOLN: 2 % | Qty: 15

## 2017-07-05 MED FILL — BD POSIFLUSH NORMAL SALINE 0.9 % INJECTION SYRINGE: INTRAMUSCULAR | Qty: 10

## 2017-07-05 MED FILL — TRAZODONE 50 MG TAB: 50 mg | ORAL | Qty: 1

## 2017-07-05 MED FILL — MAG-AL PLUS 200 MG-200 MG-20 MG/5 ML ORAL SUSPENSION: 200-200-20 mg/5 mL | ORAL | Qty: 30

## 2017-07-05 MED FILL — SODIUM CHLORIDE 0.9 % IJ SYRG: INTRAMUSCULAR | Qty: 10

## 2017-07-05 MED FILL — MD-GASTROVIEW 66 %-10 % ORAL SOLUTION: 66-10 % | ORAL | Qty: 30

## 2017-07-05 NOTE — ED Provider Notes (Signed)
Dylan Sharp is a 57 y.o. male with hx of gastritis and GERD who presents to the ED with complaint of non-radiating abdominal pain, abdominal bloating, and bilateral lumbar pain which began 3 days ago. Associated symptoms include constipation as his last bowel movement was on 07/02/17. He admits to mild flatulence since his last bowel movement. After consuming food, he feels as though it "sets in (patients) stomach" and does not go anywhere. Patient states he was recently discharged from [facility] "a couple days ago" after being in a coma for 3 days due to a lower back infection. He states after being discharged from the hospital he had a half of glass of alcohol. No chest pain, fever, dysuria, frequency, hematuria, SOB, or NVD.          The history is provided by the patient. No language interpreter was used.   Abdominal Pain    This is a new problem. The current episode started more than 2 days ago (3 days). Associated symptoms include constipation and back pain. Pertinent negatives include no fever, no diarrhea, no nausea, no vomiting, no dysuria, no frequency, no hematuria and no chest pain.        Past Medical History:   Diagnosis Date   ??? GERD (gastroesophageal reflux disease)     GERD   ??? Ill-defined condition     tumor on back       History reviewed. No pertinent surgical history.      Family History:   Problem Relation Age of Onset   ??? Hypertension Mother    ??? Diabetes Mother    ??? Hypertension Father    ??? Diabetes Father    ??? Diabetes Sister    ??? Diabetes Brother        Social History     Socioeconomic History   ??? Marital status: SINGLE     Spouse name: Not on file   ??? Number of children: Not on file   ??? Years of education: Not on file   ??? Highest education level: Not on file   Social Needs   ??? Financial resource strain: Not on file   ??? Food insecurity - worry: Not on file   ??? Food insecurity - inability: Not on file   ??? Transportation needs - medical: Not on file    ??? Transportation needs - non-medical: Not on file   Occupational History   ??? Not on file   Tobacco Use   ??? Smoking status: Current Every Day Smoker     Packs/day: 0.25     Years: 20.00     Pack years: 5.00   ??? Smokeless tobacco: Never Used   Substance and Sexual Activity   ??? Alcohol use: Yes     Comment: 1/2 pint liquor   ??? Drug use: No   ??? Sexual activity: Not on file   Other Topics Concern   ??? Not on file   Social History Narrative   ??? Not on file         ALLERGIES: Motrin [ibuprofen]    Review of Systems   Constitutional: Negative.  Negative for chills and fever.   HENT: Negative.  Negative for congestion.    Eyes: Negative.  Negative for visual disturbance.   Respiratory: Negative.  Negative for cough and shortness of breath.    Cardiovascular: Negative.  Negative for chest pain.   Gastrointestinal: Positive for abdominal pain and constipation. Negative for blood in stool, diarrhea, nausea and vomiting.  Genitourinary: Negative.  Negative for dysuria, frequency, hematuria and urgency.   Musculoskeletal: Positive for back pain. Negative for joint swelling.   Skin: Negative.  Negative for rash.   Neurological: Negative.  Negative for dizziness and light-headedness.   All other systems reviewed and are negative.      Vitals:    07/05/17 0934   BP: 134/82   Pulse: 94   Resp: 18   Temp: 99 ??F (37.2 ??C)   SpO2: 99%   Weight: 78.5 kg (173 lb)   Height: 5\' 9"  (1.753 m)            Physical Exam   Constitutional: He is oriented to person, place, and time. He appears well-developed and well-nourished. No distress.   HENT:   Head: Normocephalic and atraumatic.   Eyes: Conjunctivae are normal. Pupils are equal, round, and reactive to light. Right eye exhibits no discharge. Left eye exhibits no discharge. No scleral icterus.   Neck: Normal range of motion. Neck supple. No JVD present.   Cardiovascular: Normal heart sounds and intact distal pulses. Exam reveals no gallop and no friction rub.   No murmur heard.   Pulmonary/Chest: Effort normal and breath sounds normal. No respiratory distress. He has no wheezes. He has no rales.   Abdominal: Soft. Bowel sounds are normal. He exhibits no distension and no mass. There is tenderness in the periumbilical area. There is guarding (involuntary). There is no rebound.   Tenderness to palpation in periumbilical area with involuntary guarding.   Musculoskeletal: Normal range of motion. He exhibits no edema.   No sciatica. Negative straight leg raise.   Neurological: He is alert and oriented to person, place, and time. No cranial nerve deficit. Coordination normal.   Skin: Skin is warm and dry. No rash noted. He is not diaphoretic.   Psychiatric: He has a normal mood and affect. His behavior is normal.   Nursing note and vitals reviewed.       MDM  Number of Diagnoses or Management Options  Constipation, unspecified constipation type:   Gastroesophageal reflux disease with esophagitis:   Diagnosis management comments: 57 y.o. male with hx of gastritis presenting now with recurrent periumbilical pain. Patient in non-apparent distress. Plan for supportive care, labs, lipase for hx of drininking alcohol before symptoms began, CT scan for findings of involuntary guarding, and reasses.    Plan:   CBC, CMP, Lipase  Abd/Pelv CT w contrast  Morphine, Mylanta, Lidocaine  -----  10:02 AM  Documented by Frann Rider, acting as a scribe for Dr. Laveda Norman, Alfredia Client, MD      PROVIDER ATTESTATION:  1:20 PM    The entirety of this note, signed by me, accurately reflects all works, treatments, procedures, and medical decision making performed by me, Harriett Sine, MD.            Patient Progress  Patient progress: stable         Procedures    Diagnostic Studies:       Lab Data:  Labs Reviewed   METABOLIC PANEL, COMPREHENSIVE - Abnormal; Notable for the following components:       Result Value    Bilirubin, total 0.1 (*)     Albumin 3.2 (*)     A-G Ratio 0.9 (*)      All other components within normal limits   LIPASE - Abnormal; Notable for the following components:    Lipase 59 (*)     All other components within  normal limits   CBC WITH AUTOMATED DIFF - Abnormal; Notable for the following components:    HGB 11.5 (*)     MCHC 31.3 (*)     ABS. MONOCYTES 0.3 (*)     All other components within normal limits         ED Course:         1:14 PM  I discussed with our on-call radiologist.  Patient's PO contrast did not go through the colon so no definitive results can be made.   Patient also had dilated loops of small bowel which may be ileus.    Radiologist suggested to repeat CT scan in 2 hours for better visualization. No further contrast will be needed.      CT Scan 2:53 AM  []  Head []  C-spine []  Chest  [x]   Abd/Pelvis  []   Other:   [x]  Viewed by me   []  Interpreted by me  [x]  No acute disease  []  Abnormal: see below [x]   Discussed with Radiologist     Interpretation: no acute distress

## 2017-07-05 NOTE — ED Notes (Signed)
3:00 PM    The patient was signed out to me and care transferred to me by the outgoing provider Dr. Marcha SoldersParkes, Kallee Nam B, MD  at the beginning of my shift.    Briefly, the patient is a 57 y.o. year old male presenting with x 3 days  Non radiating abdominal pain.    Studies performed so far were gavr no definitive results. Will repeat CT scan.    The following items were pending at the time of sign out and discussed with the outgoing provider:    []  Labs  []  Xrays  []  Ultrasound  CT: []  Head   []  Neck   []  Chest   [x]  Abd/Pelvis    []  Extremity   []  C,T, or L spine  []  Other:   ---------------------------------------------------------------  []  Psych Eval  []  Sobriety  []  Symptomatic improvement  []  Transportation  []  Other:        Plan for disposition is:     []  Discharge   []  Likely Discharge []  Admit  []  Likely Admit  [x]  Pending   []  Transfer or Likely Transfer           []  This patient is in critical condition and was signed out at bedside; a full plan,                 dispo, and discussion of the patient was provided to the oncoming provider.     Diagnostic Studies:  CT Scan 4:30 PM  []  Head []  C-spine []  Chest  [x]   Abd/Pelvis  []   Other:   [x]  Viewed by me   []  Interpreted by me  [x]  No acute disease  []  Abnormal: see below []   Discussed with Radiologist     Interpretation: Complete transit of barium through the small bowel has occurred, with no evidence of obstruction or ileus. A normal appendix is visualized.     Lab Data:  Labs Reviewed   METABOLIC PANEL, COMPREHENSIVE - Abnormal; Notable for the following components:       Result Value    Bilirubin, total 0.1 (*)     Albumin 3.2 (*)     A-G Ratio 0.9 (*)     All other components within normal limits   LIPASE - Abnormal; Notable for the following components:    Lipase 59 (*)     All other components within normal limits   CBC WITH AUTOMATED DIFF - Abnormal; Notable for the following components:    HGB 11.5 (*)     MCHC 31.3 (*)     ABS. MONOCYTES 0.3 (*)      All other components within normal limits       ED Course:

## 2017-07-05 NOTE — ED Notes (Signed)
Pt discharged, verbalized understanding of instructions. Refused vital signs.

## 2017-07-05 NOTE — ED Notes (Signed)
Pt requesting to take his own trazodone. Dosage confirmed and trazodone ordered by Dr Laveda Normanran.

## 2017-07-05 NOTE — ED Triage Notes (Signed)
Pt complaining of abdominal pain and constipation for several days. Took Citrate of Magnesia without relief.

## 2018-03-18 ENCOUNTER — Emergency Department: Admit: 2018-03-18 | Payer: PRIVATE HEALTH INSURANCE | Primary: Family Medicine

## 2018-03-18 ENCOUNTER — Inpatient Hospital Stay
Admit: 2018-03-18 | Discharge: 2018-03-18 | Disposition: A | Discharge status: Home / Self Care | Payer: PRIVATE HEALTH INSURANCE | Attending: Emergency Medicine

## 2018-03-18 DIAGNOSIS — K2900 Acute gastritis without bleeding: Secondary | ICD-10-CM

## 2018-03-18 LAB — COMPREHENSIVE METABOLIC PANEL
ALT: 22 U/L (ref 12–78)
AST: 21 U/L (ref 15–37)
Albumin/Globulin Ratio: 1 (ref 1.0–3.1)
Albumin: 3.3 g/dL — ABNORMAL LOW (ref 3.4–5.0)
Alkaline Phosphatase: 85 U/L (ref 46–116)
Anion Gap: 12 mmol/L (ref 10–17)
BUN: 12 MG/DL (ref 7–18)
Bun/Cre Ratio: 18 (ref 6–20)
CO2: 29 mmol/L (ref 21–32)
Calcium: 9.1 MG/DL (ref 8.5–10.1)
Chloride: 104 mmol/L (ref 98–107)
Creatinine: 0.68 MG/DL (ref 0.6–1.3)
EGFR IF NonAfrican American: >60 mL/min/{1.73_m2} (ref >60)
GFR African American: >60 mL/min/{1.73_m2} (ref >60)
Globulin: 3.4 g/dL
Glucose: 101 mg/dL (ref 74–106)
Potassium: 3.8 mmol/L (ref 3.5–5.1)
Sodium: 141 mmol/L (ref 136–145)
Total Bilirubin: 0.2 MG/DL (ref 0.2–1.0)
Total Protein: 6.7 g/dL (ref 6.4–8.2)

## 2018-03-18 LAB — CBC WITH AUTO DIFFERENTIAL
Basophils %: 1 % (ref 0–2)
Basophils Absolute: 0 10*3/uL (ref 0.0–0.2)
Eosinophils %: 2 % (ref 0–5)
Eosinophils Absolute: 0.1 10*3/uL (ref 0.0–0.7)
Hematocrit: 36.2 % — ABNORMAL LOW (ref 36.8–45.2)
Hemoglobin: 11.7 g/dL — ABNORMAL LOW (ref 12.8–15.0)
Immature Granulocytes: 0 % (ref 0.0–5.0)
Lymphocytes %: 41 % — ABNORMAL HIGH (ref 16–40)
Lymphocytes Absolute: 1.7 10*3/uL (ref 1.2–3.4)
MCH: 29.6 PG (ref 27–31)
MCHC: 32.3 g/dL (ref 32–36)
MCV: 91.6 FL (ref 81–99)
MPV: 8.7 FL (ref 7.4–10.4)
Monocytes %: 5 % (ref 0–12)
Monocytes Absolute: 0.2 10*3/uL — ABNORMAL LOW (ref 1.1–3.2)
Neutrophils %: 51 % (ref 40–70)
Neutrophils Absolute: 2.1 10*3/uL (ref 1.4–6.5)
Platelets: 336 10*3/uL (ref 140–450)
RBC: 3.95 M/uL — ABNORMAL LOW (ref 4.0–5.2)
RDW: 13.6 % (ref 11.5–14.5)
WBC: 4.1 10*3/uL — ABNORMAL LOW (ref 4.8–10.8)

## 2018-03-18 LAB — PROTIME-INR
INR: 1.1 (ref 0.9–1.2)
Protime: 10.8 s (ref 9.0–12.0)

## 2018-03-18 LAB — APTT: aPTT: 28.5 s (ref 24.5–31.6)

## 2018-03-18 LAB — LIPASE
Lipase: 46 U/L — ABNORMAL LOW (ref 65–230)
Lipase: 46 U/L — ABNORMAL LOW (ref 65–230)

## 2018-03-18 LAB — CBC WITH AUTOMATED DIFF
ABS. BASOPHILS: 0 10*3/uL (ref 0.0–0.2)
ABS. EOSINOPHILS: 0.1 10*3/uL (ref 0.0–0.7)
ABS. LYMPHOCYTES: 1.7 10*3/uL (ref 1.2–3.4)
ABS. MONOCYTES: 0.2 10*3/uL — ABNORMAL LOW (ref 1.1–3.2)
ABS. NEUTROPHILS: 2.1 10*3/uL (ref 1.4–6.5)
BASOPHILS: 1 % (ref 0–2)
EOSINOPHILS: 2 % (ref 0–5)
HCT: 36.2 % — ABNORMAL LOW (ref 36.8–45.2)
HGB: 11.7 g/dL — ABNORMAL LOW (ref 12.8–15.0)
IMMATURE GRANULOCYTES: 0 % (ref 0.0–5.0)
LYMPHOCYTES: 41 % — ABNORMAL HIGH (ref 16–40)
MCH: 29.6 PG (ref 27–31)
MCHC: 32.3 g/dL (ref 32–36)
MCV: 91.6 FL (ref 81–99)
MONOCYTES: 5 % (ref 0–12)
MPV: 8.7 FL (ref 7.4–10.4)
NEUTROPHILS: 51 % (ref 40–70)
PLATELET: 336 10*3/uL (ref 140–450)
RBC: 3.95 M/uL — ABNORMAL LOW (ref 4.0–5.2)
RDW: 13.6 % (ref 11.5–14.5)
WBC: 4.1 10*3/uL — ABNORMAL LOW (ref 4.8–10.8)

## 2018-03-18 LAB — METABOLIC PANEL, COMPREHENSIVE
A-G Ratio: 1 (ref 1.0–3.1)
ALT (SGPT): 22 U/L (ref 12–78)
AST (SGOT): 21 U/L (ref 15–37)
Albumin: 3.3 g/dL — ABNORMAL LOW (ref 3.4–5.0)
Alk. phosphatase: 85 U/L (ref 46–116)
Anion gap: 12 mmol/L (ref 10–17)
BUN/Creatinine ratio: 18 (ref 6–20)
BUN: 12 MG/DL (ref 7–18)
Bilirubin, total: 0.2 MG/DL (ref 0.2–1.0)
CO2: 29 mmol/L (ref 21–32)
Calcium: 9.1 MG/DL (ref 8.5–10.1)
Chloride: 104 mmol/L (ref 98–107)
Creatinine: 0.68 MG/DL (ref 0.6–1.3)
GFR est AA: >60 mL/min/{1.73_m2} (ref >60)
GFR est non-AA: >60 mL/min/{1.73_m2} (ref >60)
Globulin: 3.4 g/dL
Glucose: 101 mg/dL (ref 74–106)
Potassium: 3.8 mmol/L (ref 3.5–5.1)
Protein, total: 6.7 g/dL (ref 6.4–8.2)
Sodium: 141 mmol/L (ref 136–145)

## 2018-03-18 LAB — PTT: aPTT: 28.5 s (ref 24.5–31.6)

## 2018-03-18 LAB — PROTHROMBIN TIME + INR
INR: 1.1 (ref 0.9–1.2)
Prothrombin time: 10.8 s (ref 9.0–12.0)

## 2018-03-18 MED ORDER — SODIUM CHLORIDE 0.9 % IJ SYRG
INTRAMUSCULAR | Status: DC | PRN
Start: 2018-03-18 — End: 2018-03-18

## 2018-03-18 MED ORDER — RANITIDINE 150 MG TAB
150 mg | ORAL_TABLET | Freq: Two times a day (BID) | ORAL | 0 refills | Status: AC
Start: 2018-03-18 — End: ?

## 2018-03-18 MED ORDER — FAMOTIDINE (PF) 20 MG/2 ML IV
20 mg/2 mL | INTRAVENOUS | Status: AC
Start: 2018-03-18 — End: 2018-03-18
  Administered 2018-03-18: 20:00:00 via INTRAVENOUS

## 2018-03-18 MED ORDER — LIDOCAINE 2 % MUCOSAL SOLN
2 % | Freq: Once | Status: AC
Start: 2018-03-18 — End: 2018-03-18
  Administered 2018-03-18: 20:00:00 via OROMUCOSAL

## 2018-03-18 MED ORDER — ALUM-MAG HYDROXIDE-SIMETH 200 MG-200 MG-20 MG/5 ML ORAL SUSP
200-200-20 mg/5 mL | Freq: Once | ORAL | Status: AC
Start: 2018-03-18 — End: 2018-03-18
  Administered 2018-03-18: 20:00:00 via ORAL

## 2018-03-18 MED FILL — MAG-AL PLUS 200 MG-200 MG-20 MG/5 ML ORAL SUSPENSION: 200-200-20 mg/5 mL | ORAL | Qty: 30

## 2018-03-18 MED FILL — FAMOTIDINE (PF) 20 MG/2 ML IV: 20 mg/2 mL | INTRAVENOUS | Qty: 2

## 2018-03-18 MED FILL — LIDOCAINE 2 % MUCOSAL SOLN: 2 % | Qty: 15

## 2018-03-18 NOTE — ED Notes (Signed)
Pt discharged by RN Ashley.

## 2018-03-18 NOTE — ED Triage Notes (Signed)
Patient c/o of abdominal pain described as a burning sensation. Patient reported the onset of the pain started around 5am this morning. Patient denies N/V/D, fever and chills.

## 2018-03-18 NOTE — ED Provider Notes (Signed)
Dylan Sharp is a 57 y.o. male with hx of GERD who presents to the ED with complaint of burning umbilical abdominal pain described as "being on fire" which began last night and worsened this morning. The pain is exacerbated by eating and is similar to past episodes of GERD. Pt reports that he began vomiting this morning around 6 AM with 5 episodes of dark brown "burning" emesis with the last episode being outside of the ED. Last normal BM was at 11 AM. Denies hematemesis, fever, chills, diaphoresis, diarrhea, hematochezia, melena, dizziness, lightheadedness, nausea, constipation, trauma, dysuria, frequency, hematuria, HA, CP, or back pain. For treatment, pt took 3 Zantac yesterday without relief and reports running out. PCP is at [facility] in [place] [facility]. Pt states he has not drank ETOH in months and he has not taken any Motrin or ASA.     The history is provided by the patient. No language interpreter was used.   Abdominal Pain    This is a new problem. The current episode started yesterday. The problem occurs constantly. The problem has been gradually worsening. The pain is associated with vomiting and eating. The pain is located in the periumbilical region. The quality of the pain is burning. Associated symptoms include vomiting. Pertinent negatives include no fever, no diarrhea, no hematochezia, no melena, no nausea, no constipation, no dysuria, no frequency, no hematuria, no headaches, no myalgias, no trauma, no chest pain and no back pain. The pain is worsened by eating. The pain is relieved by nothing. His past medical history is significant for GERD.        Past Medical History:   Diagnosis Date   ??? GERD (gastroesophageal reflux disease)     GERD   ??? Ill-defined condition     tumor on back       History reviewed. No pertinent surgical history.      Family History:   Problem Relation Age of Onset   ??? Hypertension Mother    ??? Diabetes Mother    ??? Hypertension Father     ??? Diabetes Father    ??? Diabetes Sister    ??? Diabetes Brother        Social History     Socioeconomic History   ??? Marital status: SINGLE     Spouse name: Not on file   ??? Number of children: Not on file   ??? Years of education: Not on file   ??? Highest education level: Not on file   Occupational History   ??? Not on file   Social Needs   ??? Financial resource strain: Not on file   ??? Food insecurity:     Worry: Not on file     Inability: Not on file   ??? Transportation needs:     Medical: Not on file     Non-medical: Not on file   Tobacco Use   ??? Smoking status: Current Every Day Smoker     Packs/day: 0.25     Years: 20.00     Pack years: 5.00   ??? Smokeless tobacco: Never Used   Substance and Sexual Activity   ??? Alcohol use: Yes     Comment: 1/2 pint liquor   ??? Drug use: No   ??? Sexual activity: Not on file   Lifestyle   ??? Physical activity:     Days per week: Not on file     Minutes per session: Not on file   ??? Stress: Not  on file   Relationships   ??? Social connections:     Talks on phone: Not on file     Gets together: Not on file     Attends religious service: Not on file     Active member of club or organization: Not on file     Attends meetings of clubs or organizations: Not on file     Relationship status: Not on file   ??? Intimate partner violence:     Fear of current or ex partner: Not on file     Emotionally abused: Not on file     Physically abused: Not on file     Forced sexual activity: Not on file   Other Topics Concern   ??? Not on file   Social History Narrative   ??? Not on file         ALLERGIES: Motrin [ibuprofen]    Review of Systems   Constitutional: Negative.  Negative for chills and fever.   HENT: Negative.  Negative for congestion and rhinorrhea.    Respiratory: Negative.  Negative for cough and shortness of breath.    Cardiovascular: Negative.  Negative for chest pain.   Gastrointestinal: Positive for abdominal pain and vomiting. Negative for constipation, diarrhea, hematochezia, melena and nausea.    Genitourinary: Negative.  Negative for dysuria, frequency and hematuria.   Musculoskeletal: Negative.  Negative for back pain and myalgias.   Skin: Negative.  Negative for wound.   Allergic/Immunologic: Negative.  Negative for immunocompromised state.   Neurological: Negative.  Negative for weakness and headaches.   All other systems reviewed and are negative.      Vitals:    03/18/18 1451   BP: 134/85   Pulse: 79   Resp: 18   Temp: 98 ??F (36.7 ??C)   SpO2: 97%   Weight: 78 kg (172 lb)   Height: 5\' 9"  (1.753 m)            Physical Exam   Constitutional: He is oriented to person, place, and time. He appears well-developed and well-nourished. No distress.   HENT:   Head: Normocephalic and atraumatic.   Eyes: Pupils are equal, round, and reactive to light. Conjunctivae and EOM are normal.   Neck: Normal range of motion. Neck supple.   Cardiovascular: Normal rate, regular rhythm, S1 normal, S2 normal, normal heart sounds and intact distal pulses. Exam reveals no gallop and no friction rub.   No murmur heard.  Pulmonary/Chest: Effort normal and breath sounds normal. No stridor. No respiratory distress. He has no wheezes. He has no rales. He exhibits no tenderness.   Abdominal: Soft. He exhibits no distension and no mass. Bowel sounds are increased. There is tenderness (Mild, with palpation) in the periumbilical area. There is no rebound and no guarding. A hernia (Small, umbilical, easily reducible) is present.   Musculoskeletal: Normal range of motion.   Neurological: He is alert and oriented to person, place, and time.   Skin: Skin is warm and dry. He is not diaphoretic.   Psychiatric: He has a normal mood and affect. His behavior is normal. Judgment and thought content normal.   Nursing note and vitals reviewed.       MDM  Number of Diagnoses or Management Options  Non-intractable vomiting without nausea, unspecified vomiting type:   Other acute gastritis without hemorrhage:   Periumbilical abdominal pain:    Diagnosis management comments: 57 y.o. male with hx of GERD presenting with report of 1 day of  uncontrolled periumbilical abdominal pain with associated vomiting. His sx are concerning for gastritis vs pancreatitis vs perforated gastric ulcer.    Plan:   Treat pain with antiacids, GI cocktail  XR Abd to evaluate for perforated viscus or free air  Checking blood count and COAGs  -----  3:04 PM  Documented by Tomasa Rand Pagliaroli, acting as a scribe for Dr. Carmie Kanner, MD     PROVIDER ATTESTATION:  12:19 PM    The entirety of this note, signed by me, accurately reflects all works, treatments, procedures, and medical decision making performed by me, Carmie Kanner, MD.            Patient Progress  Patient progress: stable          Diagnostic Studies:  XRAY 3:10 PM    []  Fingers  []  Hand  []  Wrist  []  Forearm  []  Elbow  []  Humerus  []  Shoulder    []  Toes  []  Foot  []  Ankle  []  Tib/fib  []  Knee  []  Femur  []  Hip  []  Pelvis   []  Cervical Spine  []  Thoracic Spine  []  Lumbar Spine  []  Sacrum  [x]  Abdomen        [x]  Viewed by me    []  Interpreted by me  [x]  No fracture/dislocation  []  Abnormal : see below   []  Discussed with       Radiologist        Interpretation: There is no evidence of active disease within the abdomen or the  chest. No evidence of free intracranial gas. No change from 04/13/2014.      Lab Data:  Labs Reviewed   METABOLIC PANEL, COMPREHENSIVE - Abnormal; Notable for the following components:       Result Value    Albumin 3.3 (*)     All other components within normal limits   LIPASE - Abnormal; Notable for the following components:    Lipase 46 (*)     All other components within normal limits   CBC WITH AUTOMATED DIFF - Abnormal; Notable for the following components:    WBC 4.1 (*)     RBC 3.95 (*)     HGB 11.7 (*)     HCT 36.2 (*)     LYMPHOCYTES 41 (*)     ABS. MONOCYTES 0.2 (*)     All other components within normal limits   PROTHROMBIN TIME + INR   PTT   UA WITH REFLEX MICRO AND CULTURE          ED Course:   5:37 PM  Reassessed pt, sx have resolved. Labs and COAGs are within normal limits. Will discharge.       Procedures

## 2018-03-18 NOTE — ED Notes (Signed)
Pt discharged by RN Morrie Sheldon.

## 2018-03-18 NOTE — ED Provider Notes (Signed)
Dylan Sharp is a 57 y.o. male with hx of GERD who presents to the ED with complaint of burning umbilical abdominal pain described as "being on fire" which began last night and worsened this morning. The pain is exacerbated by eating and is similar to past episodes of GERD. Pt reports that he began vomiting this morning around 6 AM with 5 episodes of dark brown "burning" emesis with the last episode being outside of the ED. Last normal BM was at 11 AM. Denies hematemesis, fever, chills, diaphoresis, diarrhea, hematochezia, melena, dizziness, lightheadedness, nausea, constipation, trauma, dysuria, frequency, hematuria, HA, CP, or back pain. For treatment, pt took 3 Zantac yesterday without relief and reports running out. PCP is at [facility] [place]. Pt states he has not drank ETOH in months and he has not taken any Motrin or ASA.     The history is provided by the patient. No language interpreter was used.   Abdominal Pain    This is a new problem. The current episode started yesterday. The problem occurs constantly. The problem has been gradually worsening. The pain is associated with vomiting and eating. The pain is located in the periumbilical region. The quality of the pain is burning. Associated symptoms include vomiting. Pertinent negatives include no fever, no diarrhea, no hematochezia, no melena, no nausea, no constipation, no dysuria, no frequency, no hematuria, no headaches, no myalgias, no trauma, no chest pain and no back pain. The pain is worsened by eating. The pain is relieved by nothing. His past medical history is significant for GERD.        Past Medical History:   Diagnosis Date   ??? GERD (gastroesophageal reflux disease)     GERD   ??? Ill-defined condition     tumor on back       History reviewed. No pertinent surgical history.      Family History:   Problem Relation Age of Onset   ??? Hypertension Mother    ??? Diabetes Mother    ??? Hypertension Father    ??? Diabetes Father     ??? Diabetes Sister    ??? Diabetes Brother        Social History     Socioeconomic History   ??? Marital status: SINGLE     Spouse name: Not on file   ??? Number of children: Not on file   ??? Years of education: Not on file   ??? Highest education level: Not on file   Occupational History   ??? Not on file   Social Needs   ??? Financial resource strain: Not on file   ??? Food insecurity:     Worry: Not on file     Inability: Not on file   ??? Transportation needs:     Medical: Not on file     Non-medical: Not on file   Tobacco Use   ??? Smoking status: Current Every Day Smoker     Packs/day: 0.25     Years: 20.00     Pack years: 5.00   ??? Smokeless tobacco: Never Used   Substance and Sexual Activity   ??? Alcohol use: Yes     Comment: 1/2 pint liquor   ??? Drug use: No   ??? Sexual activity: Not on file   Lifestyle   ??? Physical activity:     Days per week: Not on file     Minutes per session: Not on file   ??? Stress: Not  on file   Relationships   ??? Social connections:     Talks on phone: Not on file     Gets together: Not on file     Attends religious service: Not on file     Active member of club or organization: Not on file     Attends meetings of clubs or organizations: Not on file     Relationship status: Not on file   ??? Intimate partner violence:     Fear of current or ex partner: Not on file     Emotionally abused: Not on file     Physically abused: Not on file     Forced sexual activity: Not on file   Other Topics Concern   ??? Not on file   Social History Narrative   ??? Not on file         ALLERGIES: Motrin [ibuprofen]    Review of Systems   Constitutional: Negative.  Negative for chills and fever.   HENT: Negative.  Negative for congestion and rhinorrhea.    Respiratory: Negative.  Negative for cough and shortness of breath.    Cardiovascular: Negative.  Negative for chest pain.   Gastrointestinal: Positive for abdominal pain and vomiting. Negative for constipation, diarrhea, hematochezia, melena and nausea.   Genitourinary: Negative.   Negative for dysuria, frequency and hematuria.   Musculoskeletal: Negative.  Negative for back pain and myalgias.   Skin: Negative.  Negative for wound.   Allergic/Immunologic: Negative.  Negative for immunocompromised state.   Neurological: Negative.  Negative for weakness and headaches.   All other systems reviewed and are negative.      Vitals:    03/18/18 1451   BP: 134/85   Pulse: 79   Resp: 18   Temp: 98 ??F (36.7 ??C)   SpO2: 97%   Weight: 78 kg (172 lb)   Height: 5\' 9"  (1.753 m)            Physical Exam   Constitutional: He is oriented to person, place, and time. He appears well-developed and well-nourished. No distress.   HENT:   Head: Normocephalic and atraumatic.   Eyes: Pupils are equal, round, and reactive to light. Conjunctivae and EOM are normal.   Neck: Normal range of motion. Neck supple.   Cardiovascular: Normal rate, regular rhythm, S1 normal, S2 normal, normal heart sounds and intact distal pulses. Exam reveals no gallop and no friction rub.   No murmur heard.  Pulmonary/Chest: Effort normal and breath sounds normal. No stridor. No respiratory distress. He has no wheezes. He has no rales. He exhibits no tenderness.   Abdominal: Soft. He exhibits no distension and no mass. Bowel sounds are increased. There is tenderness (Mild, with palpation) in the periumbilical area. There is no rebound and no guarding. A hernia (Small, umbilical, easily reducible) is present.   Musculoskeletal: Normal range of motion.   Neurological: He is alert and oriented to person, place, and time.   Skin: Skin is warm and dry. He is not diaphoretic.   Psychiatric: He has a normal mood and affect. His behavior is normal. Judgment and thought content normal.   Nursing note and vitals reviewed.       MDM  Number of Diagnoses or Management Options  Non-intractable vomiting without nausea, unspecified vomiting type:   Other acute gastritis without hemorrhage:   Periumbilical abdominal pain:   Diagnosis management comments: 57  y.o. male with hx of GERD presenting with report of 1 day of  uncontrolled periumbilical abdominal pain with associated vomiting. His sx are concerning for gastritis vs pancreatitis vs perforated gastric ulcer.    Plan:   Treat pain with antiacids, GI cocktail  XR Abd to evaluate for perforated viscus or free air  Checking blood count and COAGs  -----  3:04 PM  Documented by Tomasa Rand Pagliaroli, acting as a scribe for Dr. Carmie Kanner, MD     PROVIDER ATTESTATION:  12:19 PM    The entirety of this note, signed by me, accurately reflects all works, treatments, procedures, and medical decision making performed by me, Carmie Kanner, MD.            Patient Progress  Patient progress: stable          Diagnostic Studies:  XRAY 3:10 PM    []  Fingers  []  Hand  []  Wrist  []  Forearm  []  Elbow  []  Humerus  []  Shoulder    []  Toes  []  Foot  []  Ankle  []  Tib/fib  []  Knee  []  Femur  []  Hip  []  Pelvis   []  Cervical Spine  []  Thoracic Spine  []  Lumbar Spine  []  Sacrum  [x]  Abdomen        [x]  Viewed by me    []  Interpreted by me  [x]  No fracture/dislocation  []  Abnormal : see below   []  Discussed with       Radiologist        Interpretation: There is no evidence of active disease within the abdomen or the  chest. No evidence of free intracranial gas. No change from 04/13/2014.      Lab Data:  Labs Reviewed   METABOLIC PANEL, COMPREHENSIVE - Abnormal; Notable for the following components:       Result Value    Albumin 3.3 (*)     All other components within normal limits   LIPASE - Abnormal; Notable for the following components:    Lipase 46 (*)     All other components within normal limits   CBC WITH AUTOMATED DIFF - Abnormal; Notable for the following components:    WBC 4.1 (*)     RBC 3.95 (*)     HGB 11.7 (*)     HCT 36.2 (*)     LYMPHOCYTES 41 (*)     ABS. MONOCYTES 0.2 (*)     All other components within normal limits   PROTHROMBIN TIME + INR   PTT   UA WITH REFLEX MICRO AND CULTURE         ED Course:   5:37 PM  Reassessed pt,  sx have resolved. Labs and COAGs are within normal limits. Will discharge.       Procedures

## 2018-03-18 NOTE — ED Notes (Signed)
Patient c/o of abdominal pain described as a burning sensation. Patient reported the onset of the pain started around 5am this morning. Patient denies N/V/D, fever and chills.

## 2018-07-05 ENCOUNTER — Inpatient Hospital Stay
Admit: 2018-07-05 | Discharge: 2018-07-05 | Discharge status: Against Medical Advice | Payer: PRIVATE HEALTH INSURANCE | Attending: Family Medicine

## 2018-07-05 ENCOUNTER — Emergency Department: Admit: 2018-07-05 | Payer: PRIVATE HEALTH INSURANCE | Primary: Family Medicine

## 2018-07-05 DIAGNOSIS — R072 Precordial pain: Secondary | ICD-10-CM

## 2018-07-05 LAB — CBC WITH AUTO DIFFERENTIAL
Basophils %: 0 % (ref 0–2)
Basophils Absolute: 0 10*3/uL (ref 0.0–0.2)
Eosinophils %: 0 % (ref 0–5)
Eosinophils Absolute: 0 10*3/uL (ref 0.0–0.7)
Hematocrit: 43.5 % (ref 36.8–45.2)
Hemoglobin: 14.1 g/dL (ref 12.8–15.0)
Immature Granulocytes: 0 % (ref 0.0–5.0)
Lymphocytes %: 41 % — ABNORMAL HIGH (ref 16–40)
Lymphocytes Absolute: 1.8 10*3/uL (ref 1.2–3.4)
MCH: 29.4 PG (ref 27–31)
MCHC: 32.4 g/dL (ref 32–36)
MCV: 90.6 FL (ref 81–99)
MPV: 8.6 FL (ref 7.4–10.4)
Monocytes %: 5 % (ref 0–12)
Monocytes Absolute: 0.2 10*3/uL — ABNORMAL LOW (ref 1.1–3.2)
Neutrophils %: 54 % (ref 40–70)
Neutrophils Absolute: 2.4 10*3/uL (ref 1.4–6.5)
Platelets: 369 10*3/uL (ref 140–450)
RBC: 4.8 M/uL (ref 4.0–5.2)
RDW: 12.9 % (ref 11.5–14.5)
WBC: 4.5 10*3/uL — ABNORMAL LOW (ref 4.8–10.8)

## 2018-07-05 LAB — DRUG SCREEN, URINE
AMPHETAMINES: NEGATIVE
Amphetamine Screen, Urine: NEGATIVE
BARBITURATES: POSITIVE — AB
BENZODIAZEPINES: NEGATIVE
Barbiturate Screen, Urine: POSITIVE — AB
Benzodiazepine Screen, Urine: NEGATIVE
Buprenorphine Screen, Urine: NEGATIVE
Buprenorphine screen, urine: NEGATIVE
COCAINE: POSITIVE — AB
Cocaine Screen Urine: POSITIVE — AB
METHADONE: POSITIVE — AB
Methadone Screen, Urine: POSITIVE — AB
Methamphetamines: NEGATIVE
Methamphetamines: NEGATIVE
OPIATES: POSITIVE — AB
OXYCODONE SCREEN: NEGATIVE
Opiate Screen, Urine: POSITIVE — AB
Oxycodone Screen: NEGATIVE
PCP Screen, Urine: NEGATIVE
PCP(PHENCYCLIDINE): NEGATIVE
PROPOXYPHENE,PPX: NEGATIVE
PROPOXYPHENE: NEGATIVE
THC (TH-CANNABINOL): NEGATIVE
THC Screen, Urine: NEGATIVE
TRICYCLICS,TCAT: NEGATIVE
TRICYCLICS: NEGATIVE

## 2018-07-05 LAB — COMPREHENSIVE METABOLIC PANEL
ALT: 27 U/L (ref 12–78)
AST: 25 U/L (ref 15–37)
Albumin/Globulin Ratio: 1.1 (ref 1.0–3.1)
Albumin: 3.9 g/dL (ref 3.4–5.0)
Alkaline Phosphatase: 91 U/L (ref 46–116)
Anion Gap: 17 mmol/L (ref 10–17)
BUN: 15 MG/DL (ref 7–18)
Bun/Cre Ratio: 17 (ref 6.0–20.0)
CO2: 27 mmol/L (ref 21–32)
Calcium: 9.5 MG/DL (ref 8.5–10.1)
Chloride: 101 mmol/L (ref 98–107)
Creatinine: 0.88 MG/DL (ref 0.6–1.3)
EGFR IF NonAfrican American: >60 mL/min/{1.73_m2} (ref >60)
GFR African American: >60 mL/min/{1.73_m2} (ref >60)
Globulin: 3.6 g/dL
Glucose: 99 mg/dL (ref 74–106)
Potassium: 3.8 mmol/L (ref 3.5–5.1)
Sodium: 141 mmol/L (ref 136–145)
Total Bilirubin: 0.3 MG/DL (ref 0.2–1.0)
Total Protein: 7.5 g/dL (ref 6.4–8.2)

## 2018-07-05 LAB — URINALYSIS WITH REFLEX TO CULTURE
Bilirubin, Urine: NEGATIVE
Blood, Urine: NEGATIVE
Glucose, Ur: NEGATIVE mg/dL
Ketones, Urine: NEGATIVE mg/dL
Leukocyte Esterase, Urine: NEGATIVE
Nitrite, Urine: NEGATIVE
Protein, UA: NEGATIVE mg/dL
Specific Gravity, UA: 1.01 (ref 1.003–1.030)
Urobilinogen, UA, POCT: 0.2 EU/dL (ref 0.2–1.0)
pH, UA: 6 (ref 5.0–7.0)

## 2018-07-05 LAB — LIPASE
Lipase: 83 U/L (ref 65–230)
Lipase: 83 U/L (ref 65–230)

## 2018-07-05 LAB — BRAIN NATRIURETIC PEPTIDE: BNP: <5 pg/mL — ABNORMAL LOW (ref 5–100)

## 2018-07-05 LAB — D-DIMER, QUANTITATIVE: D DIMER,DDMR: <100 ng/mL (D-DU) (ref <400)

## 2018-07-05 LAB — TROPONIN
Troponin I: <0.02 ng/mL (ref 0.00–0.08)
Troponin I: <0.02 ng/mL (ref 0.00–0.08)

## 2018-07-05 LAB — METABOLIC PANEL, COMPREHENSIVE
A-G Ratio: 1.1 (ref 1.0–3.1)
ALT (SGPT): 27 U/L (ref 12–78)
AST (SGOT): 25 U/L (ref 15–37)
Albumin: 3.9 g/dL (ref 3.4–5.0)
Alk. phosphatase: 91 U/L (ref 46–116)
Anion gap: 17 mmol/L (ref 10–17)
BUN/Creatinine ratio: 17 (ref 6.0–20.0)
BUN: 15 MG/DL (ref 7–18)
Bilirubin, total: 0.3 MG/DL (ref 0.2–1.0)
CO2: 27 mmol/L (ref 21–32)
Calcium: 9.5 MG/DL (ref 8.5–10.1)
Chloride: 101 mmol/L (ref 98–107)
Creatinine: 0.88 MG/DL (ref 0.6–1.3)
GFR est AA: >60 mL/min/{1.73_m2} (ref >60)
GFR est non-AA: >60 mL/min/{1.73_m2} (ref >60)
Globulin: 3.6 g/dL
Glucose: 99 mg/dL (ref 74–106)
Potassium: 3.8 mmol/L (ref 3.5–5.1)
Protein, total: 7.5 g/dL (ref 6.4–8.2)
Sodium: 141 mmol/L (ref 136–145)

## 2018-07-05 LAB — CBC WITH AUTOMATED DIFF
ABS. BASOPHILS: 0 10*3/uL (ref 0.0–0.2)
ABS. EOSINOPHILS: 0 10*3/uL (ref 0.0–0.7)
ABS. LYMPHOCYTES: 1.8 10*3/uL (ref 1.2–3.4)
ABS. MONOCYTES: 0.2 10*3/uL — ABNORMAL LOW (ref 1.1–3.2)
ABS. NEUTROPHILS: 2.4 10*3/uL (ref 1.4–6.5)
BASOPHILS: 0 % (ref 0–2)
EOSINOPHILS: 0 % (ref 0–5)
HCT: 43.5 % (ref 36.8–45.2)
HGB: 14.1 g/dL (ref 12.8–15.0)
IMMATURE GRANULOCYTES: 0 % (ref 0.0–5.0)
LYMPHOCYTES: 41 % — ABNORMAL HIGH (ref 16–40)
MCH: 29.4 PG (ref 27–31)
MCHC: 32.4 g/dL (ref 32–36)
MCV: 90.6 FL (ref 81–99)
MONOCYTES: 5 % (ref 0–12)
MPV: 8.6 FL (ref 7.4–10.4)
NEUTROPHILS: 54 % (ref 40–70)
PLATELET: 369 10*3/uL (ref 140–450)
RBC: 4.8 M/uL (ref 4.0–5.2)
RDW: 12.9 % (ref 11.5–14.5)
WBC: 4.5 10*3/uL — ABNORMAL LOW (ref 4.8–10.8)

## 2018-07-05 LAB — TROPONIN I
Troponin-I, Qt.: <0.02 ng/mL (ref 0.00–0.08)
Troponin-I, Qt.: <0.02 ng/mL (ref 0.00–0.08)

## 2018-07-05 LAB — D DIMER: D-dimer: <100 ng/mL (D-DU) (ref <400)

## 2018-07-05 LAB — UA WITH REFLEX MICRO AND CULTURE
Bilirubin: NEGATIVE
Blood: NEGATIVE
Glucose: NEGATIVE mg/dL
Ketone: NEGATIVE mg/dL
Leukocyte Esterase: NEGATIVE
Nitrites: NEGATIVE
Protein: NEGATIVE mg/dL
Specific gravity: 1.01 (ref 1.003–1.030)
Urobilinogen: 0.2 EU/dL (ref 0.2–1.0)
pH (UA): 6 (ref 5.0–7.0)

## 2018-07-05 LAB — BNP: BNP: <5 pg/mL — ABNORMAL LOW (ref 5–100)

## 2018-07-05 MED ORDER — ACETAMINOPHEN (TYLENOL) SOLUTION 32MG/ML
ORAL | Status: DC | PRN
Start: 2018-07-05 — End: 2018-07-06

## 2018-07-05 MED ORDER — PANTOPRAZOLE 40 MG IV SOLR
40 mg | INTRAVENOUS | Status: AC
Start: 2018-07-05 — End: 2018-07-05
  Administered 2018-07-05: 17:00:00 via INTRAVENOUS

## 2018-07-05 MED ORDER — SODIUM CHLORIDE 0.9% BOLUS IV
0.9 % | Freq: Once | INTRAVENOUS | Status: AC
Start: 2018-07-05 — End: 2018-07-05
  Administered 2018-07-05: 17:00:00 via INTRAVENOUS

## 2018-07-05 MED ORDER — METOCLOPRAMIDE 5 MG/ML IJ SOLN
5 mg/mL | INTRAMUSCULAR | Status: AC
Start: 2018-07-05 — End: 2018-07-05
  Administered 2018-07-05: 20:00:00 via INTRAVENOUS

## 2018-07-05 MED ORDER — LIDOCAINE 2 % MUCOSAL SOLN
2 % | Status: AC
Start: 2018-07-05 — End: 2018-07-05
  Administered 2018-07-05: 16:00:00 via OROMUCOSAL

## 2018-07-05 MED ORDER — ONDANSETRON (PF) 4 MG/2 ML INJECTION
4 mg/2 mL | INTRAMUSCULAR | Status: AC
Start: 2018-07-05 — End: 2018-07-05
  Administered 2018-07-05: 17:00:00 via INTRAVENOUS

## 2018-07-05 MED ORDER — LIDOCAINE 2 % MUCOSAL GEL
2 % | Status: DC | PRN
Start: 2018-07-05 — End: 2018-07-06

## 2018-07-05 MED ORDER — IOPAMIDOL 61 % IV SOLN
61 % | Freq: Once | INTRAVENOUS | Status: AC
Start: 2018-07-05 — End: 2018-07-05
  Administered 2018-07-05: 20:00:00 via INTRAVENOUS

## 2018-07-05 MED ORDER — SODIUM CHLORIDE 0.9 % IJ SYRG
Freq: Three times a day (TID) | INTRAMUSCULAR | Status: DC
Start: 2018-07-05 — End: 2018-07-06
  Administered 2018-07-06 (×2): via INTRAVENOUS

## 2018-07-05 MED ORDER — SODIUM CHLORIDE 0.9 % IJ SYRG
Freq: Once | INTRAMUSCULAR | Status: AC
Start: 2018-07-05 — End: 2018-07-05
  Administered 2018-07-05: 20:00:00 via INTRAVENOUS

## 2018-07-05 MED ORDER — SODIUM CHLORIDE 0.9 % IJ SYRG
INTRAMUSCULAR | Status: DC | PRN
Start: 2018-07-05 — End: 2018-07-06

## 2018-07-05 MED ORDER — ENOXAPARIN 40 MG/0.4 ML SUB-Q SYRINGE
40 mg/0.4 mL | SUBCUTANEOUS | Status: DC
Start: 2018-07-05 — End: 2018-07-06

## 2018-07-05 MED ORDER — KETOROLAC TROMETHAMINE 30 MG/ML INJECTION
30 mg/mL (1 mL) | INTRAMUSCULAR | Status: AC
Start: 2018-07-05 — End: 2018-07-06

## 2018-07-05 MED ORDER — DIATRIZOATE MEGLUMINE & SODIUM 66 %-10 % ORAL SOLN
66-10 % | ORAL | Status: AC
Start: 2018-07-05 — End: 2018-07-05
  Administered 2018-07-05: 18:00:00 via ORAL

## 2018-07-05 MED ORDER — ALUM-MAG HYDROXIDE-SIMETH 200 MG-200 MG-20 MG/5 ML ORAL SUSP
200-200-20 mg/5 mL | ORAL | Status: AC
Start: 2018-07-05 — End: 2018-07-05
  Administered 2018-07-05: 16:00:00 via ORAL

## 2018-07-05 MED ORDER — ALUM-MAG HYDROXIDE-SIMETH 200 MG-200 MG-20 MG/5 ML ORAL SUSP
200-200-20 mg/5 mL | ORAL | Status: AC
Start: 2018-07-05 — End: 2018-07-05
  Administered 2018-07-05: 22:00:00 via ORAL

## 2018-07-05 MED ORDER — LIDOCAINE 2 % MUCOSAL SOLN
2 % | Freq: Once | Status: DC
Start: 2018-07-05 — End: 2018-07-05

## 2018-07-05 MED ORDER — MORPHINE 2 MG/ML INJECTION
2 mg/mL | INTRAMUSCULAR | Status: DC | PRN
Start: 2018-07-05 — End: 2018-07-06
  Administered 2018-07-06 (×3): via INTRAVENOUS

## 2018-07-05 MED ORDER — SODIUM CHLORIDE 0.9 % IJ SYRG
INTRAMUSCULAR | Status: DC | PRN
Start: 2018-07-05 — End: 2018-07-06
  Administered 2018-07-05 (×2): via INTRAVENOUS

## 2018-07-05 MED ORDER — PANTOPRAZOLE 40 MG TAB, DELAYED RELEASE
40 mg | Freq: Every day | ORAL | Status: DC
Start: 2018-07-05 — End: 2018-07-06
  Administered 2018-07-06: 13:00:00 via ORAL

## 2018-07-05 MED ORDER — LIDOCAINE 2 % MUCOSAL SOLN
2 % | Status: AC
Start: 2018-07-05 — End: 2018-07-05
  Administered 2018-07-05: 22:00:00 via OROMUCOSAL

## 2018-07-05 MED ORDER — KETOROLAC TROMETHAMINE 30 MG/ML INJECTION
30 mg/mL (1 mL) | INTRAMUSCULAR | Status: AC
Start: 2018-07-05 — End: 2018-07-05
  Administered 2018-07-05: 17:00:00 via INTRAVENOUS

## 2018-07-05 MED FILL — ISOVUE-300  61 % INTRAVENOUS SOLUTION: 300 mg iodine /mL (61 %) | INTRAVENOUS | Qty: 100

## 2018-07-05 MED FILL — METOCLOPRAMIDE 5 MG/ML IJ SOLN: 5 mg/mL | INTRAMUSCULAR | Qty: 2

## 2018-07-05 MED FILL — LIDOCAINE 2 % MUCOSAL SOLN: 2 % | Qty: 15

## 2018-07-05 MED FILL — KETOROLAC TROMETHAMINE 30 MG/ML INJECTION: 30 mg/mL (1 mL) | INTRAMUSCULAR | Qty: 1

## 2018-07-05 MED FILL — PROTONIX 40 MG INTRAVENOUS SOLUTION: 40 mg | INTRAVENOUS | Qty: 40

## 2018-07-05 MED FILL — MAG-AL PLUS 200 MG-200 MG-20 MG/5 ML ORAL SUSPENSION: 200-200-20 mg/5 mL | ORAL | Qty: 30

## 2018-07-05 MED FILL — ONDANSETRON (PF) 4 MG/2 ML INJECTION: 4 mg/2 mL | INTRAMUSCULAR | Qty: 2

## 2018-07-05 MED FILL — SODIUM CHLORIDE 0.9 % IV: INTRAVENOUS | Qty: 1000

## 2018-07-05 MED FILL — SODIUM CHLORIDE 0.9 % IJ SYRG: INTRAMUSCULAR | Qty: 40

## 2018-07-05 MED FILL — MD-GASTROVIEW 66 %-10 % ORAL SOLUTION: 66-10 % | ORAL | Qty: 30

## 2018-07-05 MED FILL — SODIUM CHLORIDE 0.9 % IJ SYRG: INTRAMUSCULAR | Qty: 10

## 2018-07-05 MED FILL — BD POSIFLUSH NORMAL SALINE 0.9 % INJECTION SYRINGE: INTRAMUSCULAR | Qty: 10

## 2018-07-05 MED FILL — LIDOCAINE 2 % MUCOSAL GEL: 2 % | Qty: 5

## 2018-07-05 NOTE — H&P (Signed)
[facility] Hospitalist Brief History & Physical    Primary Care Provider: Other, Phys, MD        Patient interviewed and examined.  Vitals, meds, labs reviewed.  Patient will be admitted to the hospital.      DIAGNOSES PRESENT ON ADMISSION:    1. Left side chest pain  2. Polysubstance abuse - cocaine, methadone, heroin  3. gastritis  4. leukopenia  5. Chronic tobacco abuse       ALL OF THE ABOVE DIAGNOSES ARE PRESENT ON ADMISSION      PLEASE SEE JOB ID # [id number]  FOR COMPLETE HISTORY AND PHYSICAL.    Mancel Bale, MD  07/05/2018  4:53 PM

## 2018-07-05 NOTE — ED Notes (Signed)
TRANSFER - OUT REPORT:    Verbal report given to Helen Scott, Rn(name) on Dylan Sharp  being transferred to [facility] Bed # 374(unit) for routine progression of care       Report consisted of patient???s Situation, Background, Assessment and   Recommendations(SBAR).     Information from the following report(s) SBAR was reviewed with the receiving nurse.    Lines:   Peripheral IV 07/05/18 Left Forearm (Active)   Site Assessment Clean, dry, & intact 07/05/2018 11:59 AM   Phlebitis Assessment 0 07/05/2018 11:59 AM   Infiltration Assessment 0 07/05/2018 11:59 AM   Dressing Status Clean, dry, & intact 07/05/2018 11:59 AM   Dressing Type Transparent 07/05/2018 11:59 AM   Hub Color/Line Status Pink 07/05/2018 11:59 AM   Alcohol Cap Used Yes 07/05/2018 11:59 AM        Opportunity for questions and clarification was provided.      Patient transported with:   Tech

## 2018-07-05 NOTE — Progress Notes (Signed)
TRANSFER - IN REPORT:    Verbal report received from Arionna RN(name) on Mukesh L Slagel  being received from ED(unit) for routine progression of care      Report consisted of patient???s Situation, Background, Assessment and   Recommendations(SBAR).     Information from the following report(s) SBAR, Kardex, Intake/Output, MAR and Recent Results was reviewed with the receiving nurse.    Opportunity for questions and clarification was provided.      Assessment completed upon patient???s arrival to unit and care assumed.

## 2018-07-05 NOTE — ED Triage Notes (Addendum)
Patient ambulatory to ED w/ complaint of continuous burning sensation in the abdomin x 1 week. Denies N/V. States "I havent ate since Wednesday." Denies relief with bentyl and maalox. Seen at [facility] for the same reason Wednesday, prescribed bentyl and advised to drink water per patient.

## 2018-07-05 NOTE — Progress Notes (Signed)
Bedside and Verbal shift change report given to Vivian RN (oncoming nurse) by Helen RN (offgoing nurse). Report included the following information SBAR, ED Summary, Intake/Output, MAR and Recent Results.

## 2018-07-05 NOTE — ED Notes (Signed)
Lab notified about added Troponin level to be run from samples already in the lab. Spoke with Erin.

## 2018-07-05 NOTE — ED Notes (Cosign Needed)
Patient placed on telemetry box. Awaiting transport. Patient sitting in bed eating a sandwich and crackers, Meal approved by Javan, MD.
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
## 2018-07-05 NOTE — ED Notes (Signed)
Patient completed contrast. CT notified.

## 2018-07-05 NOTE — ED Notes (Signed)
Patient receiving contrast per MD order. BUN/Cr function within normal limits. Patient denies any allergies. While await completion of contrast to contact CT.

## 2018-07-05 NOTE — ED Notes (Signed)
Patient resting in bed comfortably. No apparent signs of distress noted at this time. Confesses relief since medication administration. Cardiac monitor in place. Labs drawn and resulted. IV fluids running. Bed in lowest position and call light in place. Will continue to monitor and assess.

## 2018-07-05 NOTE — ED Notes (Signed)
Awaiting patients return from CT to complete pending orders.

## 2018-07-05 NOTE — ED Notes (Signed)
Per request of the pt and with his permission, have placed a call to his girl friend Shonte Allen [phone]. There was no answer when called.

## 2018-07-05 NOTE — Progress Notes (Signed)
Care Management Interventions  PCP Verified by CM: Yes(Dr. Winn)  Mode of Transport at Discharge: Self  Current Support Network: Other  The Patient and/or Patient Representative was Provided with a Choice of Provider and Agrees with the Discharge Plan?: Yes  Freedom of Choice List was Provided with Basic Dialogue that Supports the Patient's Individualized Plan of Care/Goals, Treatment Preferences and Shares the Quality Data Associated with the Providers?: Yes  Discharge Location  Discharge Placement: Home  Dylan Sharp is a 57 year old male who presented to [facility] ED on 07/05/2018 with complaint of severe abdominal pain for several days.  He resides at [address]  with his Significant Other.  He lists Dylan Sharp ([phone]) as his emergency contact person.  His PCP is Dr. Winn.  CM will continue to follow him until he is ready for discharge.

## 2018-07-05 NOTE — Progress Notes (Signed)
Readmission Risk Screening      Name: KYNGSTEN FRAIL    Date:07/05/2018   Room #:       Column 1 Column 2 Column 3    ???Do/Have/Are you??? Low Risk Moderate Risk High Risk       Row 1   Living Situation have a live-in caregiver to assist you  OR  live alone with community support  [x]  Girlfriend                                   live alone with limited community support  []                                   live alone with no community support  OR  have live-in family who aren't actively involved in your care  []                                     Row 2 Physician have a  Primary Care  doctor [x]   [facility]                               N/A []                                   NOT have a Primary Care doctor (4pts) []                                     Row 3 Insurance insured [x]                                   N/A []                                   uninsured (4pts) []                                     Row 4 Activities of Daily Living (ADL/IADL) (ADls): groom, feed, dress, and get to the bathroom independently  (IADls): shop, budget, keep house, travel, manage medications independently  [x]                                     require temporary or regular assistance with ADls or IADls  []                                     require considerable assistance with ADls or IADls  []                                     Row 5 Disease Mgmt feel capable to manage your chronic  illness and follow  your  doctor's orders independently  [x]                                   N/A []                                   require considerable assistance managing your chronic illness and other medical needs (clinically complex) []                                     Row 6 Meds Mgmt feel capable to manage your medications independently [x]                                   require some assistance managing your medication []                                   require considerable assistance managing your medications []                                      Row 7 Polypharmacy N/A []              take> 7 different medications at home []              N/A []              Row 8 Cognitive Ability feel capable to make your own  health care decisions [x]                                   sometimes have difficulty making your own health care decisions []                                   feel incapable to make your own health care decisions (cognitive impairment) []                                     Row 9 CHF N/A [x]                                   N/A []                                   have Congestive Heart Failure (4pts)  []                                     Row 10 COPD N/A [x]                                   N/A []   have COPD (4pts) []                                     Row 11 Diabetes N/A [x]                                   N/A []                                   have Diabetes (4pts)  []                                     Row 12 HIV/AIDS N/A [x]                                   N/A []                                   have HIVor AIDS (4pts) []                                     Row 13 Fall Risk N/A [x]                                   N/A []                                   have a history of falls (4pts) []                                     Row 14 Wound Care N/A [x]                                   N/A []                                   wounds or pressure ulcers (4pts) []                                     Row 15 Alcohol N/A [x]                                   N/A []                                   had> 4 alcoholic drinks/day during last 12 months (4pts)  []   Row 16 Drugs N/A [x]              N/A []              used marijuana, cocaine, heroin, or anything else to get "high" during last 12 months (4pts) []              Row 17 Mental Health N/A [x]                                   have a history of mental illness []                                   have a  history of mental illness (4pts) []                                     Row 18 Readmission? N/A [x]                                   N/A []                                   been hospitalized in the last 30 days (4pts)  []                                     Row 19 ACO/MSSP N/A [x]                                   N/A []                                   enrolled in the Medicare Shared Savings Program(4pts) []                                      Totals:7 7     (Low Risk)                                     0       (Moderate Risk)                           0                 (?4 =High Risk)                          Screening completed by: Shari Prows, 07/05/2018

## 2018-07-05 NOTE — Progress Notes (Signed)
Care Transition Note-Patient primary care physician verified at beside.Pt appointment completed and the patient given an appointment slip ,The appointment discussed with the patient .The appointment slip added to the patient's discharge folder/chart. The patient is aware the appointment is with Dr.Jared Muhati at [address] on 07-09-2018 at 9:00AM. Regina assist with scheduling the appointment. Discharge summary can be faxed to [phone].  Phone Number-[phone].  Solomiya Pascale- Care Transition Liaison.

## 2018-07-05 NOTE — ED Notes (Addendum)
Javan,MD and NP at bedside assessing patient. Will await further instruction.

## 2018-07-05 NOTE — H&P (Signed)
[facility]  HISTORY AND PHYSICAL    Name:  Dylan Sharp, Dylan Sharp  MR#:   [id number]  DOB:  1961/02/03  ACCOUNT #:  [id number]  ADMIT DATE:  07/05/2018      CHIEF COMPLAINT:  Left-sided chest pain for 2 weeks.    HISTORY OF PRESENT ILLNESS:  The patient is a pleasant 58 year old African-American male, polysubstance abuser, who presents with complaints of left-sided sharp chest pain that has been constant in duration for 2 weeks.  He denies any radiation to neck, back, or left upper extremity.  The patient denied using any illicit drugs when I asked him directly.  However, his toxicology screen was positive for cocaine, heroin, and methadone.  The patient states he last used heroin about 6 or 7 months ago when in reality it was much more recently.  He denies any shortness of breath, dyspnea on exertion, extremity edema, numbness, fever, headache, dizziness, or vision change.  He denies any history of heart disease, coronary artery disease, myocardial infarction, or CHF.  He says that last summer, he accidentally overdosed on fentanyl and was at [facility] in the intensive care unit for about 2 weeks; however, he denies he had a previous medical history.    ALLERGIES:  NO KNOWN DRUG ALLERGIES.    OUTPATIENT MEDICATIONS:  He states he only take Zantac for acid reflux.    PAST SURGICAL HISTORY:  None.    PAST MEDICAL HISTORY:  Per HPI.  In addition, he has a history of acid reflux.    FAMILY HISTORY:  He said both of his parents, mother and father, died from diabetes-related complication.    SOCIAL HISTORY:  He is not working.  He is single.  He has 2 children.  He smokes cigarettes more than 40 years.  He admits to periodic intranasal heroin use.  He denies marijuana, cocaine, or alcohol use.  He does not have a primary care physician.    PHYSICAL EXAMINATION:  VITAL SIGNS:  Show a temperature 98.5, pulse 75, respiratory rate 16, blood pressure 131/87, oxygen saturation 100% on room air.   GENERAL:  The patient is pleasant, alert and oriented x4, sitting up comfortably on the hospital bed with oxygen by nasal cannula.  HEENT:  Atraumatic, normocephalic.  Anicteric.  PERRLA.  No oropharyngeal lesions.  NECK:  Supple.  CARDIOVASCULAR:  Positive S1 and S2.  Regular rate and rhythm.  LUNGS:  Clear to auscultation bilaterally.  ABDOMEN:  Soft, nontender, and nondistended.  Normoactive bowel sounds.  EXTREMITIES:  No edema, clubbing, or cyanosis.  Full range of motion.  NEUROLOGIC:  Strength and sensation grossly intact.  SKIN:  Warm and moist.    LABORATORIES AND STUDIES:  Abdominopelvic CT scan done today showed a diffuse gastric wall thickening secondary to gastritis.  WBC 4.5, hemoglobin 14.1, platelets 369.  Urinalysis was unremarkable.  INR 1.1.  D-dimer less than 100.  PTT 28.5.  Sodium 141, potassium 3.8, chloride 101, CO2 27, glucose 99, BUN 15, creatinine 0.88, calcium 9.5.  Total bilirubin 0.3, total protein 7.5, ALT 27, AST 25, alkaline phosphatase 91, lipase 83, troponin less than 0.02 on two measurements.  B-type natriuretic peptide less than 5.  Toxicology screen was positive for cocaine, methadone, and opioids, otherwise negative.  Glucose 99.    IMPRESSION:  1.  Left-sided chest pain.  2.  Gastritis.  3.  Polysubstance abuse - cocaine, methadone, and heroin.  4.  Leukopenia.  5.  Chronic tobacco abuse.    The  above diagnoses were present at the time of admission.    PLAN:  The patient will be brought in as observation patient.  The emergency room physician has already spoken with Dr. Lequita Halt, who plans to do a stress test.  We will make the patient n.p.o. after midnight for stress test tomorrow.  We will give him appropriate cardiac medications including sublingual nitroglycerin, aspirin, morphine, and oxygen as needed.  We will also give him proton pump inhibitor for his gastritis.  We will get him a substance abuse counselor.  We will continue to follow the patient in hospital stay.     Total time for admission process greater than 33 minutes.      Mancel Bale, MD      [id number]/  D:  07/05/2018 17:05  T:  07/05/2018 22:53  JOB #:  [id number]  CC:  Marylee Floras, MD       Haywood Pao, MD

## 2018-07-05 NOTE — Progress Notes (Signed)
Patient is alert and oriented x 4. Denies chest pain and shortness of breath. Tele show SR.  Patient complained of pain in lower abdomen. Patient asked for food tray and received one. Remain NPO after midnight for possible stress test tomorrow. Will continue to monitor

## 2018-07-05 NOTE — ED Notes (Signed)
Dr. Hamlette currently at bedside.

## 2018-07-05 NOTE — Consults (Signed)
[facility]  CONSULTATION    Name:  Dylan Sharp, Dylan Sharp  MR#:   [id number]  DOB:  07-08-1960  ACCOUNT #:  [id number]  DATE OF SERVICE:  07/06/2018    ATTENDING PHYSICIAN:  Hospitalist.    HISTORY OF PRESENT ILLNESS:  The patient is a 58 year old man who is referred for cardiology evaluation.  He has no previous history of heart disease.    He reports that over the last week or so, he has been having abdominal discomfort.  The pain is worse after he eats anything.  He has not had any vomiting, diarrhea, or constipation.  He says he feels "bloated" with a lot of gas.  There is no associated dyspnea or diaphoresis.  He denies having actual chest pain.    He never had hypertension or diabetes mellitus.  He smokes about a pack of cigarettes a day.  He thinks his brother has some kind of heart trouble, but the details are unclear.  His cholesterol has not been elevated.  He is usually an active man with no exertional symptoms.    PAST MEDICAL HISTORY:  Otherwise unremarkable.    CURRENT MEDICATIONS:  Prior to admission, none.    SOCIAL HISTORY:  He is not employed.  He lives with his girlfriend.  He admits that in the past, he was a heavy alcohol user, but he stopped drinking several months ago.  He denies any illicit drug use.    PHYSICAL EXAMINATION:  GENERAL:  Today, he is a slim man, in no distress.  VITAL SIGNS:  The blood pressure is 118/69, heart rate 79 and regular, respirations normal.  He is afebrile.  HEENT:  Notable for poor dentition.  LUNGS:  Clear.  CARDIOVASCULAR:  The venous pressure is not elevated.  The carotid pulses are normal without bruits.  The precordium is quiet.  The PMI is not displaced.  Heart sounds are normal with no murmurs or extra sounds.  ABDOMEN:  Soft.  He has mild epigastric tenderness.  EXTREMITIES:  The peripheral pulses are normal.  There is no peripheral cyanosis, clubbing, or edema.    ELECTROCARDIOGRAM:  The resting electrocardiogram shows increased voltage and is otherwise  within normal limits.    LABORATORY DATA:  Lab values are notable for negative troponin levels.  The lipase is normal.    IMAGING DATA:  CT scan of the abdomen showed evidence of gastritis.    IMPRESSION:  Abdominal pain, likely gastritis, rule out peptic ulcer disease.    There was apparently some concern that he was having chest pain and a stress test was ordered.  I will review the findings.  From my interview and examination, there does not appear to be any significant heart disease.  If the stress test proves to be negative, then I think he could be safely followed up as an outpatient.  In the meantime, he may well require more in the way of gastrointestinal evaluation and treatment.      Marylee Floras, MD      [id number]  D:  07/06/2018 12:24  T:  07/06/2018 17:27  JOB #:  [id number]

## 2018-07-05 NOTE — ED Notes (Signed)
Per request of the pt and with his permission, have placed a call to his girl friend Glo Herring [phone]. There was no answer when called.

## 2018-07-05 NOTE — Progress Notes (Signed)
Care Management Interventions  PCP Verified by CM: Yes(Dr. Neil Crouch)  Mode of Transport at Discharge: Self  Current Support Network: Other  The Patient and/or Patient Representative was Provided with a Choice of Provider and Agrees with the Discharge Plan?: Yes  Freedom of Choice List was Provided with Basic Dialogue that Supports the Patient's Individualized Plan of Care/Goals, Treatment Preferences and Shares the Quality Data Associated with the Providers?: Yes  Discharge Location  Discharge Placement: Home  Dylan Sharp is a 58 year old male who presented to [facility] ED on 07/05/2018 with complaint of severe abdominal pain for several days.  He resides at [address]  with his Significant Other.  He lists Larene Beach [phone]) as his emergency contact person.  His PCP is Dr. Neil Crouch.  CM will continue to follow him until he is ready for discharge.

## 2018-07-05 NOTE — ED Notes (Signed)
Lab notified about added Troponin level to be run from samples already in the lab. Spoke with [employer].

## 2018-07-05 NOTE — H&P (Signed)
[facility]  HISTORY AND PHYSICAL    Name:  Dylan Sharp, Dylan Sharp  MR#:   [id number]  DOB:  [birth year]  ACCOUNT #:  [id number]  ADMIT DATE:  07/05/2018      CHIEF COMPLAINT:  Left-sided chest pain for 2 weeks.    HISTORY OF PRESENT ILLNESS:  The patient is a pleasant 58 year old African-American male, polysubstance abuser, who presents with complaints of left-sided sharp chest pain that has been constant in duration for 2 weeks.  He denies any radiation to neck, back, or left upper extremity.  The patient denied using any illicit drugs when I asked him directly.  However, his toxicology screen was positive for cocaine, heroin, and methadone.  The patient states he last used heroin about 6 or 7 months ago when in reality it was much more recently.  He denies any shortness of breath, dyspnea on exertion, extremity edema, numbness, fever, headache, dizziness, or vision change.  He denies any history of heart disease, coronary artery disease, myocardial infarction, or CHF.  He says that last summer, he accidentally overdosed on fentanyl and was at [facility] in the intensive care unit for about 2 weeks; however, he denies he had a previous medical history.    ALLERGIES:  NO KNOWN DRUG ALLERGIES.    OUTPATIENT MEDICATIONS:  He states he only take Zantac for acid reflux.    PAST SURGICAL HISTORY:  None.    PAST MEDICAL HISTORY:  Per HPI.  In addition, he has a history of acid reflux.    FAMILY HISTORY:  He said both of his parents, mother and father, died from diabetes-related complication.    SOCIAL HISTORY:  He is not working.  He is single.  He has 2 children.  He smokes cigarettes more than 40 years.  He admits to periodic intranasal heroin use.  He denies marijuana, cocaine, or alcohol use.  He does not have a primary care physician.    PHYSICAL EXAMINATION:  VITAL SIGNS:  Show a temperature 98.5, pulse 75, respiratory rate 16, blood pressure 131/87, oxygen saturation 100% on room air.  GENERAL:  The patient is  pleasant, alert and oriented x4, sitting up comfortably on the hospital bed with oxygen by nasal cannula.  HEENT:  Atraumatic, normocephalic.  Anicteric.  PERRLA.  No oropharyngeal lesions.  NECK:  Supple.  CARDIOVASCULAR:  Positive S1 and S2.  Regular rate and rhythm.  LUNGS:  Clear to auscultation bilaterally.  ABDOMEN:  Soft, nontender, and nondistended.  Normoactive bowel sounds.  EXTREMITIES:  No edema, clubbing, or cyanosis.  Full range of motion.  NEUROLOGIC:  Strength and sensation grossly intact.  SKIN:  Warm and moist.    LABORATORIES AND STUDIES:  Abdominopelvic CT scan done today showed a diffuse gastric wall thickening secondary to gastritis.  WBC 4.5, hemoglobin 14.1, platelets 369.  Urinalysis was unremarkable.  INR 1.1.  D-dimer less than 100.  PTT 28.5.  Sodium 141, potassium 3.8, chloride 101, CO2 27, glucose 99, BUN 15, creatinine 0.88, calcium 9.5.  Total bilirubin 0.3, total protein 7.5, ALT 27, AST 25, alkaline phosphatase 91, lipase 83, troponin less than 0.02 on two measurements.  B-type natriuretic peptide less than 5.  Toxicology screen was positive for cocaine, methadone, and opioids, otherwise negative.  Glucose 99.    IMPRESSION:  1.  Left-sided chest pain.  2.  Gastritis.  3.  Polysubstance abuse - cocaine, methadone, and heroin.  4.  Leukopenia.  5.  Chronic tobacco abuse.    The  above diagnoses were present at the time of admission.    PLAN:  The patient will be brought in as observation patient.  The emergency room physician has already spoken with Dr. Lequita Halt, who plans to do a stress test.  We will make the patient n.p.o. after midnight for stress test tomorrow.  We will give him appropriate cardiac medications including sublingual nitroglycerin, aspirin, morphine, and oxygen as needed.  We will also give him proton pump inhibitor for his gastritis.  We will get him a substance abuse counselor.  We will continue to follow the patient in hospital stay.    Total time for admission  process greater than 33 minutes.      Mancel Bale, MD      [id number]/  D:  07/05/2018 17:05  T:  07/05/2018 22:53  JOB #:  [id number]  CC:  Marylee Floras, MD       Haywood Pao, MD

## 2018-07-05 NOTE — Progress Notes (Signed)
Bedside and Verbal shift change report given to Maureen Ralphs RN (oncoming nurse) by Courtney Paris (offgoing nurse). Report included the following information SBAR, ED Summary, Intake/Output, MAR and Recent Results.
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
## 2018-07-05 NOTE — Progress Notes (Signed)
Care Transition Note-Patient primary care physician verified at beside.Pt appointment completed and the patient given an appointment slip ,The appointment discussed with the patient .The appointment slip added to the patient's discharge folder/chart. The patient is aware the appointment is with Dr.Jared Muhati at [facility] on 07-09-2018 at 9:00AM. Rene Kocher assist with scheduling the appointment. Discharge summary can be faxed to [phone].  Phone Number-[phone].  Gwinda Maine- Care Transition Liaison.

## 2018-07-05 NOTE — ED Notes (Unsigned)
Patient placed on telemetry box. Awaiting transport. Patient sitting in bed eating a sandwich and crackers, Meal approved by Lucius Conn, MD.

## 2018-07-05 NOTE — ED Notes (Signed)
Patient resting in bed comfortably. No apparent signs of distress noted at this time. Confesses relief since medication administration. Cardiac monitor in place. Labs drawn and resulted. IV fluids running. Bed in lowest position and call light in place. Will continue to monitor and assess.

## 2018-07-05 NOTE — ED Notes (Signed)
Awaiting patients return from CT to complete pending orders.

## 2018-07-05 NOTE — ED Notes (Signed)
 Patient ambulatory to ED w/ complaint of continuous burning sensation in the abdomin x 1 week. Denies N/V. States I havent ate since Wednesday. Denies relief with bentyl and maalox. Seen at [facility]  for the same reason Wednesday, prescribed bentyl and advised to drink water per patient.

## 2018-07-05 NOTE — Consults (Signed)
[facility]  CONSULTATION    Name:  Dylan Sharp, Dylan Sharp  MR#:   [id number]  DOB:  [birth year]  ACCOUNT #:  [id number]  DATE OF SERVICE:  07/06/2018      Requested by Dr. Waynetta Sandy.    I saw this patient on the floor on 07/06/2018.    CHIEF COMPLAINT:  Neurologic evaluation for the lumbar disk disease.    HISTORY OF PRESENT ILLNESS:  This 58 year old African-American male patient has a history of polysubstance abuse; history of toxicology screen positive for cocaine, heroin, and methadone.    He was brought to the ER, admitted in [facility] with left-sided sharp chest pain that has been constant for the last 2 weeks.  He denies any radiation of this pain to neck, back, or left arm.  He denied any illicit drug use, but his toxicology screen was positive for cocaine, heroin, and methadone.  He told the other physician that he used heroin 6-7 months ago.    His workup was done.  Cardiologist has seen the patient.  The patient was also having some abdominal pain and abdominal discomfort.  So, CT of the abdomen was done and CT of the abdomen and pelvis incidentally showed lumbar level and it showed significant disk disease, L3-L4, L4-L5 area.  There is a large disk herniation seen at L3-L4 and there is a pressure on the L5 nerve root also seen on this test.    I was called by the house physician to see the patient because of this finding, actually seen on the CT of the abdomen and pelvis examination.  MRI lumbar spine was not done.  So, I was called to come and see the patient.    When I came and I found out the patient was talking with the nurses to leave AMA.    He did not want to stay.    I tried to talk with him also to stay because of his lumbar disk disease.  I told him I was called to see him, but he refused to talk too much and he said he wants to go home and then he said he will come to office, but he does not want to stay in the hospital.  He denied any back pains or leg pains.    PAST MEDICAL  HISTORY:  1.  Drug abuse.  2.  History of substance abuse.  3.  Chest pain.    ALLERGIES:  NONE.    REVIEW OF SYSTEMS:  Tried, could not be done completely.    SOCIAL HISTORY:  He is not working.  He is single.  He has 2 children.  Smoke cigarettes.  He admits periodic intranasal heroin use.  He denies marijuana, cocaine, or alcohol abuse.  He does not have the primary care physician.    MEDICATIONS:  He was taking Zantac for acid reflux.    NEUROLOGICAL EXAMINATION:  He is awake.  He is alert.  His speech is normal.  I saw him arguing with the nurses.  Comprehension normal.  I saw his face is symmetrical.    He did not like to examine much, but his eyes were moving in all direction.  Pupils reactive.  I saw him moving all four extremities well and he was walking around in the hallways.    IMPRESSION:  This 58 year old male patient came with chest pain actually.  He has a history of drug abuse.  His toxicology screen is positive for multiple drugs.  Because of his abdominal pain and discomfort, CT abdomen and pelvis was done, and incidentally CT abdomen and pelvis also showed lumbar levels and shows large disk herniation L3-L4 and pressure on the L5 nerve root.  I was called to see the patient on this ground.    When I came, I really could not talk and examine him completely.  The patient was planning to leave AMA.    My plan was to do MRI lumbar spine and EMG nerve conduction studies because of this acute radiculopathy, possible issue.    So, the patient did not want to stay and he decided that he will sign AMA and he will leave.  I told him that he can come to my office and MRI lumbar spine and EMG nerve conduction studies will be done outpatient in my office and then further plans can be decided to do physical therapy or give the surgery consultation if needed.    I told the nurses to give the patient my office number.      Achille Rich, MD      [id number]/  D:  07/07/2018 21:06  T:  07/08/2018 0:41  JOB #:   [id number]

## 2018-07-05 NOTE — Progress Notes (Signed)
Patient is alert and oriented x 4. Denies chest pain and shortness of breath. Tele show SR.  Patient complained of pain in lower abdomen. Patient asked for food tray and received one. Remain NPO after midnight for possible stress test tomorrow. Will continue to monitor

## 2018-07-05 NOTE — Progress Notes (Signed)
 TRANSFER - IN REPORT:    Verbal report received from Arionna RN(name) on Dylan Sharp  being received from ED(unit) for routine progression of care      Report consisted of patient's Situation, Background, Assessment and   Recommendations(SBAR).     Information from the following report(s) SBAR, Kardex, Intake/Output, MAR and Recent Results was reviewed with the receiving nurse.    Opportunity for questions and clarification was provided.      Assessment completed upon patient's arrival to unit and care assumed.

## 2018-07-05 NOTE — ED Notes (Signed)
Patient receiving contrast per MD order. BUN/Cr function within normal limits. Patient denies any allergies. While await completion of contrast to contact CT.

## 2018-07-05 NOTE — Progress Notes (Signed)
 Readmission Risk Screening      Name: DELWIN RACZKOWSKI    Date:07/05/2018   Room #:       Column 1 Column 2 Column 3    "Do/Have/Are you" Low Risk Moderate Risk High Risk       Row 1   Living Situation have a live-in caregiver to assist you  OR  live alone with community support  [x]  Girlfriend                                   live alone with limited community support  []                                   live alone with no community support  OR  have live-in family who aren't actively involved in your care  []                                     Row 2 Physician have a  Primary Care  doctor [x]   [facility]                               N/A []                                   NOT have a Primary Care doctor (4pts) []                                     Row 3 Insurance insured [x]                                   N/A []                                   uninsured (4pts) []                                     Row 4 Activities of Daily Living (ADL/IADL) (ADls): groom, feed, dress, and get to the bathroom independently  (IADls): shop, budget, keep house, travel, manage medications independently  [x]                                     require temporary or regular assistance with ADls or IADls  []                                     require considerable assistance with ADls or IADls  []                                     Row 5 Disease Mgmt feel capable to manage your chronic  illness and follow  your  doctor's orders independently  [x]                                   N/A []                                   require considerable assistance managing your chronic illness and other medical needs (clinically complex) []                                     Row 6 Meds Mgmt feel capable to manage your medications independently [x]                                   require some assistance managing your medication []                                   require considerable assistance managing your medications []                                     Row 7  Polypharmacy N/A []              take> 7 different medications at home []              N/A []              Row 8 Cognitive Ability feel capable to make your own  health care decisions [x]                                   sometimes have difficulty making your own health care decisions []                                   feel incapable to make your own health care decisions (cognitive impairment) []                                     Row 9 CHF N/A [x]                                   N/A []                                   have Congestive Heart Failure (4pts)  []                                     Row 10 COPD N/A [x]                                   N/A []   have COPD (4pts) []                                     Row 11 Diabetes N/A [x]                                   N/A []                                   have Diabetes (4pts)  []                                     Row 12 HIV/AIDS N/A [x]                                   N/A []                                   have HIVor AIDS (4pts) []                                     Row 13 Fall Risk N/A [x]                                   N/A []                                   have a history of falls (4pts) []                                     Row 14 Wound Care N/A [x]                                   N/A []                                   wounds or pressure ulcers (4pts) []                                     Row 15 Alcohol N/A [x]                                   N/A []                                   had> 4 alcoholic drinks/day during last 12 months (4pts)  []   Row 16 Drugs N/A [x]              N/A []              used marijuana, cocaine, heroin, or anything else to get high during last 12 months (4pts) []              Row 17 Mental Health N/A [x]                                   have a history of mental illness []                                   have a history of mental illness (4pts) []                                      Row 18 Readmission? N/A [x]                                   N/A []                                   been hospitalized in the last 30 days (4pts)  []                                     Row 19 ACO/MSSP N/A [x]                                   N/A []                                   enrolled in the Medicare Shared Savings Program(4pts) []                                      Totals:7 7     (Low Risk)                                     0       (Moderate Risk)                           0                 (?4 =High Risk)                          Screening completed by: Rock ONEIDA Crest, 07/05/2018

## 2018-07-05 NOTE — ED Notes (Signed)
Javan,MD and NP at bedside assessing patient. Will await further instruction.

## 2018-07-05 NOTE — ED Notes (Signed)
Patient completed contrast CT notified.

## 2018-07-05 NOTE — ED Notes (Signed)
Dr. Leroy Libman currently at bedside.

## 2018-07-05 NOTE — ED Notes (Signed)
 TRANSFER - OUT REPORT:    Verbal report given to Sherrilyn Hamilton, Rn(name) on Ubaldo LITTIE Gulling  being transferred to [facility](unit) for routine progression of care       Report consisted of patient's Situation, Background, Assessment and   Recommendations(SBAR).     Information from the following report(s) SBAR was reviewed with the receiving nurse.    Lines:   Peripheral IV 07/05/18 Left Forearm (Active)   Site Assessment Clean, dry, & intact 07/05/2018 11:59 AM   Phlebitis Assessment 0 07/05/2018 11:59 AM   Infiltration Assessment 0 07/05/2018 11:59 AM   Dressing Status Clean, dry, & intact 07/05/2018 11:59 AM   Dressing Type Transparent 07/05/2018 11:59 AM   Hub Color/Line Status Pink 07/05/2018 11:59 AM   Alcohol Cap Used Yes 07/05/2018 11:59 AM        Opportunity for questions and clarification was provided.      Patient transported with:   [employer]

## 2018-07-05 NOTE — Consults (Signed)
[facility]  CONSULTATION    Name:  Dylan Sharp, Dylan Sharp  MR#:   [id number]  DOB:  1961/05/17  ACCOUNT #:  [id number]  DATE OF SERVICE:  07/06/2018    A patient of Dr. Leroy Libman and also Dr. Waynetta Sandy.    CHIEF COMPLAINT:  Abdominal pain, abnormal CAT scan showing diffuse gastritis, and also atypical chest pain.    HISTORY OF PRESENT ILLNESS:  The patient is a 58 year old African-American male who has been admitted to the hospital because of chest pain and also abdominal pain.  The patient states that he has been having this pain for quite some time.  He has not been drinking any alcohol for the last eight months; however, before that, he used to drink heavily.  He denied using drugs; however, his tox screen is positive.  He has been taking Zantac, which is off the market now.  I have told him not to take it.    He has acid reflux.  He feels nauseated, but no vomiting.  No constipation or diarrhea.  No rectal bleeding.  He is asking for pain medications.    ALLERGIES:  HE HAS QUESTIONABLE ALLERGY TO IBUPROFEN (MOTRIN), WHICH CAUSES STOMACH UPSET.    PAST MEDICAL HISTORY:  Significant for according to the record, the abnormal finding on diagnostic imaging, chest pain, gastritis, substernal chest pain, and torn medial meniscus in the past.    CURRENT MEDICATIONS:  Which he has been on are acetaminophen p.r.n., Mylanta one time dose, Lovenox, Toradol as needed.  Also, he is on Zofran, pantoprazole, and simethicone.    SOCIAL HISTORY:  He smokes.  He states that he has not been taking drugs, but he only uses marijuana.  According to Dr. Andreas Ohm note, he has been using heroin intranasally.  He has not had any alcohol for the last eight months.  Before that, he used to drink heavily.    REVIEW OF SYSTEMS:  He has been having this pain and also suffers from arthritis, nausea, heartburn and indigestion.  No constipation or diarrhea.  No fever or chills.  No rectal bleeding or constipation.  He has back pain.  The rest of the  systems is okay.    PHYSICAL EXAMINATION:  GENERAL:  This is an asthenic black male who was getting echocardiogram downstairs in Cardiology when he was seen.  VITAL SIGNS:  Reported in the computer:  Temperature 98.4, pulse is 79, respirations 18, and blood pressure 118/69.  HEENT:  Head is normocephalic.  Sclerae are nonicteric.  NECK:  Supple.  LUNGS:  No rales or wheezing.  HEART:  Regular rate and rhythm.  PMI displaced laterally.  ABDOMEN:  Soft.  Positive bowel sounds.  Subjective tenderness in the epigastric area without guarding or rebound.  EXTREMITIES:  No edema.    LABORATORY DATA:  His lab work had shown that hemoglobin and hematocrit are 14.1 and 43.5.  WBC only 4500.  Urinalysis is negative.  PT/INR is 1.1 and 10.8.  Chemistry shows normal LFTs.  BUN is 16, creatinine 0.65, potassium 3.9, sodium 139.  Lipase is 83.  Toxicology is positive for barbiturates, cocaine, methadone, opiates.    IMAGING DATA:  His CAT scan of the abdomen and pelvis shows diffuse gastric wall thickening, suspected gastritis; small hiatal hernia; subpleural scarring in the right lung base; background chronic obstructive disease changes in the lung fields; nonobstructive bowel gas pattern; normal appendix; left paracentral and lateral disk herniation at L4-L5 level compressing left L5 root as it  is exiting the thecal canal.  There is also compression of the left L4 root in the foramen.    IMPRESSION AND PLAN:  Abdominal pain and gastritis as per CAT scan.  He has been getting Protonix.  I am going to switch it to sucralfate 1 g four times a day.    I have advised him that he should have upper endoscopy done, but he states that he has to leave today and he cannot come as an outpatient for the procedure because of authorization.  We may need authorization for him to come back in the hospital, so if he comes back to my office, then I will schedule him at outpatient surgical center for upper endoscopy.  In the meantime, sucralfate  can be continued.  He is still using drugs and also has pain medicine seeking behavior, so he needs to be referred to Pain Management and the toxicology, discuss with Dr. Waynetta Sandy.  I am going to start him on full liquid, which can be slowly advanced.  If there are any questions, please feel free to call me.    Thank you very much.      Arbie Cookey, MD      [id number]  D:  07/06/2018 12:04  T:  07/06/2018 17:15  JOB #:  [id number]

## 2018-07-05 NOTE — H&P (Signed)
H&P by Mancel Bale, MD at 07/05/18 1653                Author: Mancel Bale, MD  Service: Internal Medicine  Author Type: Physician       Filed: 07/05/18 1705  Date of Service: 07/05/18 1653  Status: Signed          Editor: Mancel Bale, MD (Physician)                             [facility] Hospitalist Brief History & Physical      Primary Care Provider: Other, Phys, MD            Patient interviewed and examined.   Vitals, meds, labs reviewed.   Patient will be admitted to the hospital.         DIAGNOSES PRESENT ON ADMISSION:      1.  Left side chest pain   2.  Polysubstance abuse - cocaine, methadone, heroin   3.  gastritis   4.  leukopenia   5.  Chronic tobacco abuse          ALL OF THE ABOVE DIAGNOSES ARE PRESENT ON ADMISSION         PLEASE SEE JOB ID # [id number]  FOR COMPLETE HISTORY AND PHYSICAL.      Mancel Bale, MD   07/05/2018   4:53 PM

## 2018-07-06 LAB — EKG 12-LEAD
Atrial Rate: 63 {beats}/min
Atrial Rate: 66 {beats}/min
Diagnosis: NORMAL
Diagnosis: NORMAL
P Axis: 72 degrees
P Axis: 72 degrees
P-R Interval: 144 ms
P-R Interval: 148 ms
Q-T Interval: 390 ms
Q-T Interval: 396 ms
QRS Duration: 84 ms
QRS Duration: 86 ms
QTc Calculation (Bazett): 399 ms
QTc Calculation (Bazett): 415 ms
R Axis: 65 degrees
R Axis: 70 degrees
T Axis: 54 degrees
T Axis: 64 degrees
Ventricular Rate: 63 {beats}/min
Ventricular Rate: 66 {beats}/min

## 2018-07-06 LAB — BASIC METABOLIC PANEL
Anion Gap: 14 mmol/L (ref 10–17)
BUN: 16 MG/DL (ref 7–18)
Bun/Cre Ratio: 25 — ABNORMAL HIGH (ref 6–20)
CO2: 28 mmol/L (ref 21–32)
Calcium: 8.5 MG/DL (ref 8.5–10.1)
Chloride: 101 mmol/L (ref 98–107)
Creatinine: 0.65 MG/DL (ref 0.6–1.3)
EGFR IF NonAfrican American: >60 mL/min/{1.73_m2} (ref >60)
GFR African American: >60 mL/min/{1.73_m2} (ref >60)
Glucose: 82 mg/dL (ref 74–106)
Potassium: 3.9 mmol/L (ref 3.5–5.1)
Sodium: 139 mmol/L (ref 136–145)

## 2018-07-06 LAB — METABOLIC PANEL, BASIC
Anion gap: 14 mmol/L (ref 10–17)
BUN/Creatinine ratio: 25 — ABNORMAL HIGH (ref 6–20)
BUN: 16 MG/DL (ref 7–18)
CO2: 28 mmol/L (ref 21–32)
Calcium: 8.5 MG/DL (ref 8.5–10.1)
Chloride: 101 mmol/L (ref 98–107)
Creatinine: 0.65 MG/DL (ref 0.6–1.3)
GFR est AA: >60 mL/min/{1.73_m2} (ref >60)
GFR est non-AA: >60 mL/min/{1.73_m2} (ref >60)
Glucose: 82 mg/dL (ref 74–106)
Potassium: 3.9 mmol/L (ref 3.5–5.1)
Sodium: 139 mmol/L (ref 136–145)

## 2018-07-06 LAB — EKG, 12 LEAD, SUBSEQUENT
Atrial Rate: 63 {beats}/min
Calculated P Axis: 72 degrees
Calculated R Axis: 65 degrees
Calculated T Axis: 54 degrees
Diagnosis: NORMAL
P-R Interval: 144 ms
Q-T Interval: 390 ms
QRS Duration: 86 ms
QTC Calculation (Bezet): 399 ms
Ventricular Rate: 63 {beats}/min

## 2018-07-06 LAB — EKG, 12 LEAD, INITIAL
Atrial Rate: 66 {beats}/min
Calculated P Axis: 72 degrees
Calculated R Axis: 70 degrees
Calculated T Axis: 64 degrees
Diagnosis: NORMAL
P-R Interval: 148 ms
Q-T Interval: 396 ms
QRS Duration: 84 ms
QTC Calculation (Bezet): 415 ms
Ventricular Rate: 66 {beats}/min

## 2018-07-06 MED ORDER — PHARMACY VANCOMYCIN NOTE
Status: DC | PRN
Start: 2018-07-06 — End: 2018-07-06

## 2018-07-06 MED ORDER — PANTOPRAZOLE 40 MG IV SOLR
40 mg | Freq: Every day | INTRAVENOUS | Status: DC
Start: 2018-07-06 — End: 2018-07-06
  Administered 2018-07-06: 15:00:00 via INTRAVENOUS

## 2018-07-06 MED ORDER — SIMETHICONE 80 MG CHEWABLE TAB
80 mg | Freq: Four times a day (QID) | ORAL | Status: DC | PRN
Start: 2018-07-06 — End: 2018-07-06

## 2018-07-06 MED ORDER — METHADONE 10 MG TAB
10 mg | Freq: Every day | ORAL | Status: DC
Start: 2018-07-06 — End: 2018-07-06
  Administered 2018-07-06: 18:00:00 via ORAL

## 2018-07-06 MED ORDER — SUCRALFATE 1 GRAM TAB
1 gram | Freq: Four times a day (QID) | ORAL | Status: DC
Start: 2018-07-06 — End: 2018-07-06
  Administered 2018-07-06: 18:00:00 via ORAL

## 2018-07-06 MED ORDER — METHADONE 10 MG TAB
10 mg | ORAL | Status: AC
Start: 2018-07-06 — End: 2018-07-05
  Administered 2018-07-06: 01:00:00 via ORAL

## 2018-07-06 MED FILL — MORPHINE 2 MG/ML INJECTION: 2 mg/mL | INTRAMUSCULAR | Qty: 1

## 2018-07-06 MED FILL — PROTONIX 40 MG INTRAVENOUS SOLUTION: 40 mg | INTRAVENOUS | Qty: 40

## 2018-07-06 MED FILL — PHARMACY VANCOMYCIN NOTE: Qty: 1

## 2018-07-06 MED FILL — METHADONE 10 MG TAB: 10 mg | ORAL | Qty: 3

## 2018-07-06 MED FILL — SODIUM CHLORIDE 0.9 % IJ SYRG: INTRAMUSCULAR | Qty: 10

## 2018-07-06 MED FILL — PANTOPRAZOLE 40 MG TAB, DELAYED RELEASE: 40 mg | ORAL | Qty: 1

## 2018-07-06 MED FILL — ENOXAPARIN 40 MG/0.4 ML SUB-Q SYRINGE: 40 mg/0.4 mL | SUBCUTANEOUS | Qty: 0.4

## 2018-07-06 MED FILL — SUCRALFATE 1 GRAM TAB: 1 gram | ORAL | Qty: 1

## 2018-07-06 NOTE — Other (Signed)
Case Management/Social Work Disposition Note    Name:  Dylan Sharp      DOB:  02-24-1961        MRN:  [id number]      Discharge Date:  07/06/2018      Discharged to: Home    Care giver Name and contact information : Self      Transportation provided by: Self    Follow-up appointment with: N/A    Discharge information provided to patient/family: Yes    Josephine Igo  07/06/2018

## 2018-07-06 NOTE — Consults (Signed)
[facility]  CONSULTATION    Name:  Dylan Sharp, Dylan Sharp  MR#:   [id number]  DOB:  11/08/1960  ACCOUNT #:  [id number]  DATE OF SERVICE:  07/06/2018    ATTENDING PHYSICIAN:  Hospitalist.    HISTORY OF PRESENT ILLNESS:  The patient is a 58 year old man who is referred for cardiology evaluation.  He has no previous history of heart disease.    He reports that over the last week or so, he has been having abdominal discomfort.  The pain is worse after he eats anything.  He has not had any vomiting, diarrhea, or constipation.  He says he feels "bloated" with a lot of gas.  There is no associated dyspnea or diaphoresis.  He denies having actual chest pain.    He never had hypertension or diabetes mellitus.  He smokes about a pack of cigarettes a day.  He thinks his brother has some kind of heart trouble, but the details are unclear.  His cholesterol has not been elevated.  He is usually an active man with no exertional symptoms.    PAST MEDICAL HISTORY:  Otherwise unremarkable.    CURRENT MEDICATIONS:  Prior to admission, none.    SOCIAL HISTORY:  He is not employed.  He lives with his girlfriend.  He admits that in the past, he was a heavy alcohol user, but he stopped drinking several months ago.  He denies any illicit drug use.    PHYSICAL EXAMINATION:  GENERAL:  Today, he is a slim man, in no distress.  VITAL SIGNS:  The blood pressure is 118/69, heart rate 79 and regular, respirations normal.  He is afebrile.  HEENT:  Notable for poor dentition.  LUNGS:  Clear.  CARDIOVASCULAR:  The venous pressure is not elevated.  The carotid pulses are normal without bruits.  The precordium is quiet.  The PMI is not displaced.  Heart sounds are normal with no murmurs or extra sounds.  ABDOMEN:  Soft.  He has mild epigastric tenderness.  EXTREMITIES:  The peripheral pulses are normal.  There is no peripheral cyanosis, clubbing, or edema.    ELECTROCARDIOGRAM:  The resting electrocardiogram shows increased voltage  and is otherwise within normal limits.    LABORATORY DATA:  Lab values are notable for negative troponin levels.  The lipase is normal.    IMAGING DATA:  CT scan of the abdomen showed evidence of gastritis.    IMPRESSION:  Abdominal pain, likely gastritis, rule out peptic ulcer disease.    There was apparently some concern that he was having chest pain and a stress test was ordered.  I will review the findings.  From my interview and examination, there does not appear to be any significant heart disease.  If the stress test proves to be negative, then I think he could be safely followed up as an outpatient.  In the meantime, he may well require more in the way of gastrointestinal evaluation and treatment.      Marylee Floras, MD      [id number]  D:  07/06/2018 12:24  T:  07/06/2018 17:27  JOB #:  [id number]

## 2018-07-06 NOTE — Other (Signed)
Reason for Admission: Chest pain [R07.9]  Gastritis [K29.70]  Abnormal finding of diagnostic imaging [R93.89]  Substernal chest pain [R07.2]      Problem List:   Hospital Problems  Date Reviewed: 2016-05-06          Codes Class Noted POA    Gastritis ICD-10-CM: K29.70  ICD-9-CM: 535.50  07/05/2018 Unknown        Chest pain ICD-10-CM: R07.9  ICD-9-CM: 786.50  07/05/2018 Unknown        Abnormal finding of diagnostic imaging ICD-10-CM: R93.89  ICD-9-CM: 793.99  07/05/2018 Unknown        Substernal chest pain ICD-10-CM: R07.2  ICD-9-CM: 786.51  07/05/2018 Unknown              Background:    Past Medical History:   Past Medical History:   Diagnosis Date   ??? GERD (gastroesophageal reflux disease)     GERD   ??? Ill-defined condition     tumor on back         Recent Vital Signs:    Wt Readings from Last 3 Encounters:   07/05/18 63.4 kg (139 lb 12.8 oz)   03/18/18 78 kg (172 lb)   07/05/17 78.5 kg (173 lb)     Temp Readings from Last 3 Encounters:   07/06/18 98.1 ??F (36.7 ??C)   03/18/18 98 ??F (36.7 ??C)   07/05/17 99 ??F (37.2 ??C)     BP Readings from Last 3 Encounters:   07/06/18 110/73   03/18/18 134/85   07/05/17 134/82     Pulse Readings from Last 3 Encounters:   07/06/18 63   03/18/18 79   07/05/17 94           Telemetry: normal sinus rhythm                                    Pain Assessment:  Pain Intensity 1: 4 (07/05/18 1317)  Pain Location 1: Abdomen        Patient Stated Pain Goal: 0            Time of last Intervention: 30 minutes  Effectiveness of intervention: complete resolution  Other Actions to relieve pain: pain med             Summary of Shift :    Bedside and Verbal shift change report given to Horace (oncoming nurse) by vivian (offgoing nurse). Report included the following information SBAR, Kardex and MAR assumed patient care at this time, pain med given x2. Patient stated on methadone programme dr Hyacinth Meeker made aware one time dose  of  30 mg ordered for the patient .patient remain npo for possible stress test  in am. Patient is stable at this time will continue to monitor.

## 2018-07-06 NOTE — Progress Notes (Signed)
Chaplain responded to code Aqua, internal flood to offer emotional and spiritual support.It appears patient stopped up sink and flooded the floor.  Environmental Services were on site cleaning the room. Chaplain consulted with nurse. Chaplain available as needed. Angela Bullock

## 2018-07-06 NOTE — Progress Notes (Signed)
Problem: Patient Education: Go to Patient Education Activity  Goal: Patient/Family Education  Description  Patient education   Outcome: Progressing Towards Goal     Problem: Unstable angina/NSTEMI: Day of Admission/Day 1  Goal: Consults, if ordered  Outcome: Progressing Towards Goal  Goal: Diagnostic Test/Procedures  Description  Dignostic Test/Procedure   Outcome: Progressing Towards Goal     Problem: Falls - Risk of  Goal: *Absence of Falls  Description  Document Schmid Fall Risk and appropriate interventions in the flowsheet.  Outcome: Progressing Towards Goal  Note: Fall Risk Interventions:                                Problem: Patient Education: Go to Patient Education Activity  Goal: Patient/Family Education  Outcome: Progressing Towards Goal

## 2018-07-06 NOTE — Progress Notes (Signed)
Consult dictated  Abdominal Pain and CT Scan showing Diffused gastritis  Start on Carafate one gram four times a day  He wants to leave today  Discussed in detail  He can come to me see me as out patient  Full liquids and advance  Thanks

## 2018-07-06 NOTE — Progress Notes (Signed)
Care Transition Note- Observation Notice sign and given on 07-06-2018.  Gahel Safley- Care Transition Liaison.

## 2018-07-06 NOTE — Progress Notes (Signed)
Patient signed Leaving Against Medical Advice form.  DR Moon made aware. Left forearm intravenous peripheral removed /

## 2018-07-06 NOTE — Progress Notes (Signed)
Methadone dose verified with [facility] as Methadone 30 mg p o daily.Dosage confirmed by  LPN Gamble [phone].

## 2018-07-06 NOTE — Consults (Signed)
[facility]  CONSULTATION    Name:  Dylan Sharp, Dylan Sharp  MR#:   [id number]  DOB:  December 30, 1960  ACCOUNT #:  [id number]  DATE OF SERVICE:  07/06/2018      Requested by Dr. Waynetta Sandy.    I saw this patient on the floor on 07/06/2018.    CHIEF COMPLAINT:  Neurologic evaluation for the lumbar disk disease.    HISTORY OF PRESENT ILLNESS:  This 58 year old African-American male patient has a history of polysubstance abuse; history of toxicology screen positive for cocaine, heroin, and methadone.    He was brought to the ER, admitted in [facility] with left-sided sharp chest pain that has been constant for the last 2 weeks.  He denies any radiation of this pain to neck, back, or left arm.  He denied any illicit drug use, but his toxicology screen was positive for cocaine, heroin, and methadone.  He told the other physician that he used heroin 6-7 months ago.    His workup was done.  Cardiologist has seen the patient.  The patient was also having some abdominal pain and abdominal discomfort.  So, CT of the abdomen was done and CT of the abdomen and pelvis incidentally showed lumbar level and it showed significant disk disease, L3-L4, L4-L5 area.  There is a large disk herniation seen at L3-L4 and there is a pressure on the L5 nerve root also seen on this test.    I was called by the house physician to see the patient because of this finding, actually seen on the CT of the abdomen and pelvis examination.  MRI lumbar spine was not done.  So, I was called to come and see the patient.    When I came and I found out the patient was talking with the nurses to leave AMA.    He did not want to stay.    I tried to talk with him also to stay because of his lumbar disk disease.  I told him I was called to see him, but he refused to talk too much and he said he wants to go home and then he said he will come to office, but he does not want to stay in the hospital.  He denied any back pains or leg pains.    PAST MEDICAL HISTORY:   1.  Drug abuse.  2.  History of substance abuse.  3.  Chest pain.    ALLERGIES:  NONE.    REVIEW OF SYSTEMS:  Tried, could not be done completely.    SOCIAL HISTORY:  He is not working.  He is single.  He has 2 children.  Smoke cigarettes.  He admits periodic intranasal heroin use.  He denies marijuana, cocaine, or alcohol abuse.  He does not have the primary care physician.    MEDICATIONS:  He was taking Zantac for acid reflux.    NEUROLOGICAL EXAMINATION:  He is awake.  He is alert.  His speech is normal.  I saw him arguing with the nurses.  Comprehension normal.  I saw his face is symmetrical.    He did not like to examine much, but his eyes were moving in all direction.  Pupils reactive.  I saw him moving all four extremities well and he was walking around in the hallways.    IMPRESSION:  This 58 year old male patient came with chest pain actually.  He has a history of drug abuse.  His toxicology screen is positive for multiple drugs.  Because of his abdominal pain and discomfort, CT abdomen and pelvis was done, and incidentally CT abdomen and pelvis also showed lumbar levels and shows large disk herniation L3-L4 and pressure on the L5 nerve root.  I was called to see the patient on this ground.    When I came, I really could not talk and examine him completely.  The patient was planning to leave AMA.    My plan was to do MRI lumbar spine and EMG nerve conduction studies because of this acute radiculopathy, possible issue.    So, the patient did not want to stay and he decided that he will sign AMA and he will leave.  I told him that he can come to my office and MRI lumbar spine and EMG nerve conduction studies will be done outpatient in my office and then further plans can be decided to do physical therapy or give the surgery consultation if needed.    I told the nurses to give the patient my office number.      Achille Rich, MD      [id number]/  D:  07/07/2018 21:06  T:  07/08/2018 0:41  JOB #:  [id number]

## 2018-07-06 NOTE — Discharge Summary (Signed)
[facility] Hospitalist Discharge Summary:    Admit Date: 07/05/2018 10:11 AM       Discharge Date: 07/06/2018     Hospital Problem:      Hospital Problems  Date Reviewed: 04/19/2016          Codes Class Noted POA    Gastritis ICD-10-CM: K29.70  ICD-9-CM: 535.50  07/05/2018 Unknown        Chest pain ICD-10-CM: R07.9  ICD-9-CM: 786.50  07/05/2018 Unknown        Abnormal finding of diagnostic imaging ICD-10-CM: R93.89  ICD-9-CM: 793.99  07/05/2018 Unknown        Substernal chest pain ICD-10-CM: R07.2  ICD-9-CM: 786.51  07/05/2018 Unknown                Pt states he wants to leave AMA  Nursing staff has advised him to remain in the hospital.  I have counseled him to remain in the hospital.  Discussed risk/dangers of leaving AMA.  Discussed benefits of ongoing treatment.  Pt still refuses to remain in the hospital.  Advised him they could suffer further injury, permanent injury and even death.  Patient expressed understanding.  Pt is AAOx3 and can make medical decisions.  Please see H and p from 07/05/18    Tymon Nemetz Ritika Hellickson, MD

## 2018-07-06 NOTE — Progress Notes (Signed)
Chaplain made initial visit offering emotional and spiritual support. Patient shared he was in lots of pain. Patient said, "I want to go home, but my stomach hurts too bad. I want to get the test and then go home." Chaplain consoled patient concerning his pain. 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 Patient expressed his greatest hope as to continue to work and take care of himself. Patient expressed no fears. Chaplain available as needed. Abner Greenspan    Spiritual Care Assessment/Progress Notes    LORIMER TAKASHIMA [id number]  [id number]    Mar 12, 1961  58 y.o.  male    Patient Telephone Number: [phone] (home)   Religious Affiliation: [employer]   Language: English   Extended Emergency Contact Information  Primary Emergency Contact: Theodoro Kos  Address: [address] [place] [address] [facility] [place]  Home Phone: [phone]  Relation: Sister   Patient Active Problem List    Diagnosis Date Noted   ??? Gastritis 07/05/2018   ??? Chest pain 07/05/2018   ??? Abnormal finding of diagnostic imaging 07/05/2018   ??? Substernal chest pain 07/05/2018   ??? 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  []          Not able to assess due to medical condition    Services Provided Today:  []          crisis intervention    [x]          reading Scriptures  []          spiritual assessment    [x]          prayer  [x]          empathic listening/emotional support  []          rites and rituals (cite in comments)   []          life review     []          religious support  []          theological development   []          advocacy  []          ethical dialog     [x]          blessing  []          bereavement support    []          support to family  []          anticipatory grief support   []          help with AMD  []          spiritual guidance    []   meditation      Spiritual Care Needs  [x]          Emotional Support  [x]          Spiritual/Religious Care  []          Loss/Adjustment  []          Advocacy/Referral                /Ethics  []          No needs expressed at               this time  []          Other: (note in               comments)  Spiritual Care Plan  []          Follow up visits with               pt/family  []          Provide materials  []          Schedule sacraments  []          Contact Community               Clergy  [x]          Follow up as needed  []          Other: (note in               comments)     Comments:     Dola Argyle

## 2018-07-06 NOTE — Consults (Signed)
[facility]  CONSULTATION    Name:  Dylan Sharp, Dylan Sharp  MR#:   [id number]  DOB:  1960-06-18  ACCOUNT #:  [id number]  DATE OF SERVICE:  07/06/2018    A patient of Dr. Leroy Libman and also Dr. Waynetta Sandy.    CHIEF COMPLAINT:  Abdominal pain, abnormal CAT scan showing diffuse gastritis, and also atypical chest pain.    HISTORY OF PRESENT ILLNESS:  The patient is a 58 year old African-American male who has been admitted to the hospital because of chest pain and also abdominal pain.  The patient states that he has been having this pain for quite some time.  He has not been drinking any alcohol for the last eight months; however, before that, he used to drink heavily.  He denied using drugs; however, his tox screen is positive.  He has been taking Zantac, which is off the market now.  I have told him not to take it.    He has acid reflux.  He feels nauseated, but no vomiting.  No constipation or diarrhea.  No rectal bleeding.  He is asking for pain medications.    ALLERGIES:  HE HAS QUESTIONABLE ALLERGY TO IBUPROFEN (MOTRIN), WHICH CAUSES STOMACH UPSET.    PAST MEDICAL HISTORY:  Significant for according to the record, the abnormal finding on diagnostic imaging, chest pain, gastritis, substernal chest pain, and torn medial meniscus in the past.    CURRENT MEDICATIONS:  Which he has been on are acetaminophen p.r.n., Mylanta one time dose, Lovenox, Toradol as needed.  Also, he is on Zofran, pantoprazole, and simethicone.    SOCIAL HISTORY:  He smokes.  He states that he has not been taking drugs, but he only uses marijuana.  According to Dr. Andreas Ohm note, he has been using heroin intranasally.  He has not had any alcohol for the last eight months.  Before that, he used to drink heavily.    REVIEW OF SYSTEMS:  He has been having this pain and also suffers from arthritis, nausea, heartburn and indigestion.  No constipation or diarrhea.  No fever or chills.  No rectal bleeding or constipation.  He  has back pain.  The rest of the systems is okay.    PHYSICAL EXAMINATION:  GENERAL:  This is an asthenic black male who was getting echocardiogram downstairs in Cardiology when he was seen.  VITAL SIGNS:  Reported in the computer:  Temperature 98.4, pulse is 79, respirations 18, and blood pressure 118/69.  HEENT:  Head is normocephalic.  Sclerae are nonicteric.  NECK:  Supple.  LUNGS:  No rales or wheezing.  HEART:  Regular rate and rhythm.  PMI displaced laterally.  ABDOMEN:  Soft.  Positive bowel sounds.  Subjective tenderness in the epigastric area without guarding or rebound.  EXTREMITIES:  No edema.    LABORATORY DATA:  His lab work had shown that hemoglobin and hematocrit are 14.1 and 43.5.  WBC only 4500.  Urinalysis is negative.  PT/INR is 1.1 and 10.8.  Chemistry shows normal LFTs.  BUN is 16, creatinine 0.65, potassium 3.9, sodium 139.  Lipase is 83.  Toxicology is positive for barbiturates, cocaine, methadone, opiates.    IMAGING DATA:  His CAT scan of the abdomen and pelvis shows diffuse gastric wall thickening, suspected gastritis; small hiatal hernia; subpleural scarring in the right lung base; background chronic obstructive disease changes in the lung fields; nonobstructive bowel gas pattern; normal appendix; left paracentral and lateral disk herniation at L4-L5 level compressing left L5 root as it  is exiting the thecal canal.  There is also compression of the left L4 root in the foramen.    IMPRESSION AND PLAN:  Abdominal pain and gastritis as per CAT scan.  He has been getting Protonix.  I am going to switch it to sucralfate 1 g four times a day.    I have advised him that he should have upper endoscopy done, but he states that he has to leave today and he cannot come as an outpatient for the procedure because of authorization.  We may need authorization for him to come back in the hospital, so if he comes back to my office, then I will  schedule him at outpatient surgical center for upper endoscopy.  In the meantime, sucralfate can be continued.  He is still using drugs and also has pain medicine seeking behavior, so he needs to be referred to Pain Management and the toxicology, discuss with Dr. Waynetta Sandy.  I am going to start him on full liquid, which can be slowly advanced.  If there are any questions, please feel free to call me.    Thank you very much.      Arbie Cookey, MD      [id number]  D:  07/06/2018 12:04  T:  07/06/2018 17:15  JOB #:  [id number]
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
## 2018-07-06 NOTE — Progress Notes (Signed)
Care Transition Note- Observation Notice sign and given on 07-06-2018.  Gwinda Maine- Care Transition Liaison.

## 2018-07-06 NOTE — Progress Notes (Signed)
Patient signed Leaving Against Medical Advice form.  DR Waynetta Sandy made aware. Left forearm intravenous peripheral removed /

## 2018-07-06 NOTE — Progress Notes (Signed)
 Chaplain made initial visit offering emotional and spiritual support. Patient shared he was in lots of pain. Patient said, I want to go home, but my stomach hurts too bad. I want to get the test and then go home. Chaplain consoled patient concerning his pain. 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 Patient expressed his greatest hope as to continue to work and take care of himself. Patient expressed no fears. Chaplain available as needed. Jon Raspberry    Spiritual Care Assessment/Progress Notes    YACQUB BASTON [id number]  [id number]    [birth year]  58 y.o.  male    Patient Telephone Number: [phone] (home)   Religious Affiliation: Bertie   Language: English   Extended Emergency Contact Information  Primary Emergency Contact: GULLING BUERGER  Address: [address] [place] [address] [place]  Home Phone: [phone]  Relation: Sister   Patient Active Problem List    Diagnosis Date Noted   . Gastritis 07/05/2018   . Chest pain 07/05/2018   . Abnormal finding of diagnostic imaging 07/05/2018   . Substernal chest pain 07/05/2018   . 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  []          Not able to assess due to medical condition    Services Provided Today:  []          crisis intervention    [x]          reading Scriptures  []          spiritual assessment    [x]          prayer  [x]          empathic listening/emotional support  []          rites and rituals (cite in comments)  []           life review     []          religious support  []          theological development   []          advocacy  []          ethical dialog     [x]          blessing  []          bereavement support    []          support to family  []          anticipatory grief support   []          help with AMD  []          spiritual guidance    []   meditation      Spiritual Care Needs  [x]          Emotional Support  [x]          Spiritual/Religious Care  []          Loss/Adjustment  []          Advocacy/Referral                /Ethics  []          No needs expressed at               this time  []          Other: (note in               comments)  Spiritual Care Plan  []          Follow up visits with               pt/family  []          Provide materials  []          Schedule sacraments  []          Contact Community               Clergy  [x]          Follow up as needed  []          Other: (note in               comments)     Comments:     Jon Raspberry

## 2018-07-06 NOTE — Progress Notes (Signed)
Methadone dose verified with [facility] as Methadone 30 mg p o daily.Dosage confirmed by  LPN Elsie Lincoln [phone].

## 2018-07-06 NOTE — Progress Notes (Signed)
Consult dictated  Abdominal Pain and CT Scan showing Diffused gastritis  Start on Carafate one gram four times a day  He wants to leave today  Discussed in detail  He can come to me see me as out patient  Full liquids and advance  Thanks
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
## 2018-07-06 NOTE — Progress Notes (Signed)
Problem: Patient Education: Go to Patient Education Activity  Goal: Patient/Family Education  Description  Patient education   Outcome: Progressing Towards Goal     Problem: Unstable angina/NSTEMI: Day of Admission/Day 1  Goal: Consults, if ordered  Outcome: Progressing Towards Goal  Goal: Diagnostic Test/Procedures  Description  Dignostic Test/Procedure   Outcome: Progressing Towards Goal     Problem: Falls - Risk of  Goal: *Absence of Falls  Description  Document Dylan Sharp Fall Risk and appropriate interventions in the flowsheet.  Outcome: Progressing Towards Goal  Note: Fall Risk Interventions:                                Problem: Patient Education: Go to Patient Education Activity  Goal: Patient/Family Education  Outcome: Progressing Towards Goal

## 2018-07-07 LAB — EKG 12-LEAD
Atrial Rate: 79 {beats}/min
Diagnosis: NORMAL
P Axis: 69 degrees
P-R Interval: 144 ms
Q-T Interval: 372 ms
QRS Duration: 82 ms
QTc Calculation (Bazett): 426 ms
R Axis: 76 degrees
T Axis: 68 degrees
Ventricular Rate: 79 {beats}/min

## 2018-07-07 LAB — EKG, 12 LEAD, INITIAL
Atrial Rate: 79 {beats}/min
Calculated P Axis: 69 degrees
Calculated R Axis: 76 degrees
Calculated T Axis: 68 degrees
Diagnosis: NORMAL
P-R Interval: 144 ms
Q-T Interval: 372 ms
QRS Duration: 82 ms
QTC Calculation (Bezet): 426 ms
Ventricular Rate: 79 {beats}/min

## 2018-07-07 NOTE — Telephone Encounter (Signed)
Discharge Call 1st attempt-Patient was not reached. Phone message was left.   Graciela Plato- Care Transition Liaison.

## 2018-07-08 NOTE — Telephone Encounter (Signed)
Discharge Call 2nd attempt  -Phone number incorrect.  Clancy Mullarkey- Care Transition Liaison.

## 2018-09-08 LAB — ECHO TTE STRESS EXERCISE TREADMILL COMP
Max Diastolic BP: 100 mmHg
Max Heart Rate: 146 {beats}/min
Max Systolic BP: 140 mmHg
Max. Diastolic BP: 100 mmHg
Max. Heart rate: 146 {beats}/min
Max. Systolic BP: 140 mmHg
Peak EX METS: 8.4 METS
Peak Ex METs: 8.4 METS
Reason for Termination: 82 {beats}/min
Reason for Termination: 82 {beats}/min

## 2019-11-28 ENCOUNTER — Ambulatory Visit
Admission: EM | Admit: 2019-11-28 | Discharge: 2019-11-28 | Disposition: A | Discharge status: Home / Self Care | Payer: PRIVATE HEALTH INSURANCE | Attending: Emergency Medicine | Admitting: Emergency Medicine

## 2019-11-28 ENCOUNTER — Other Ambulatory Visit: Payer: Self-pay

## 2019-11-28 ENCOUNTER — Encounter: Payer: Self-pay | Admitting: Emergency Medicine

## 2019-11-28 DIAGNOSIS — M7989 Other specified soft tissue disorders: Secondary | ICD-10-CM

## 2019-11-28 DIAGNOSIS — D1721 Benign lipomatous neoplasm of skin and subcutaneous tissue of right arm: Secondary | ICD-10-CM

## 2019-11-28 HISTORY — DX: Scoliosis, unspecified: M41.9

## 2019-11-28 MED ORDER — METHYLPREDNISOLONE SODIUM SUCC 125 MG IJ SOLR
125.0000 mg | Freq: Once | INTRAMUSCULAR | Status: AC
  Administered 2019-11-28: 125 mg via INTRAMUSCULAR

## 2019-11-28 NOTE — ED Triage Notes (Addendum)
Pt presents to [facility] for assessment of 4 months of bilateral hand swelling and unswelling, numbness and pins and needles sensation to hands and arms and bilateral feet.  Patient states he was diagnosed in [place] with scoliosis and told that was his issue.  Patient states he is having trouble walking due to it.  Pt also notes an area to his right bicep that is swollen as of this morning.

## 2019-11-28 NOTE — ED Provider Notes (Signed)
[facility]    CSN: [id number] Arrival date & time: 11/28/19  1701      History   Chief Complaint Chief Complaint  Patient presents with  . Hand Pain  . Numbness    HPI Jordan Tanner is a 59 y.o. male with self-reported history of scoliosis presenting for chronic intermittent bilateral hand pain and swelling.  States is been going on intermittently for 4 months: No specific inciting event, alleviating factors.  Does endorse intermittent paresthesias with this.  Denies recent trauma to the affected area.  Has also been having them in bilateral feet.  Also notes right bicipital subcutaneous mass without tenderness.  Unsure how long its been there.  Denies fever, chills, arthralgias, myalgias, unintentional weight loss.  No reported history of autoimmune or inflammatory disease.  Denies history of MS.  Does not currently have primary care [place].  Denies cardiac, hepatic, renal disease.  No change in urination or bowel habit.  No abdominal pain, back pain, fever.   Past Medical History:  Diagnosis Date  . Scoliosis     There are no problems to display for this patient.   History reviewed. No pertinent surgical history.     Home Medications    Prior to Admission medications   Not on File    Family History Family History  Family history unknown: Yes    Social History Social History   Tobacco Use  . Smoking status: Current Every Day Smoker    Packs/day: 0.50  . Smokeless tobacco: Never Used  Substance Use Topics  . Alcohol use: Yes    Comment: beers on weekends  . Drug use: Never     Allergies   Motrin [ibuprofen]   Review of Systems As per HPI   Physical Exam Triage Vital Signs ED Triage Vitals  Enc Vitals Group     BP      Pulse      Resp      Temp      Temp src      SpO2      Weight      Height      Head Circumference      Peak Flow      Pain Score      Pain Loc      Pain Edu?      Excl. in [place]?    No  data found.  Updated Vital Signs BP (!) 133/93 (BP Location: Left Arm)   Pulse 88   Temp 98.6 F (37 C) (Oral)   Resp 18   SpO2 97%   Visual Acuity Right Eye Distance:   Left Eye Distance:   Bilateral Distance:    Right Eye Near:   Left Eye Near:    Bilateral Near:     Physical Exam Constitutional:      General: He is not in acute distress. HENT:     Head: Normocephalic and atraumatic.  Eyes:     General: No scleral icterus.    Pupils: Pupils are equal, round, and reactive to light.  Cardiovascular:     Rate and Rhythm: Normal rate.  Pulmonary:     Effort: Pulmonary effort is normal. No respiratory distress.     Breath sounds: No wheezing.  Musculoskeletal:        General: Swelling and tenderness present. No deformity. Normal range of motion.     Comments: Moderate hand swelling bilaterally without rash, erythema, warmth.  No  crepitus or bony deformity.  Subcutaneous lipoma noted to right bicep.  Nontender.  Skin:    Coloration: Skin is not jaundiced or pale.  Neurological:     Mental Status: He is alert and oriented to person, place, and time.     Sensory: Sensory deficit present.     Coordination: Coordination normal.     Deep Tendon Reflexes: Reflexes normal.     Comments: Patient reports decrease sensation bilaterally on palmar and dorsal aspect of bilateral hands.      UC Treatments / Results  Labs (all labs ordered are listed, but only abnormal results are displayed) Labs Reviewed - No data to display  EKG   Radiology No results found.  Procedures Procedures (including critical care time)  Medications Ordered in UC Medications  methylPREDNISolone sodium succinate (SOLU-MEDROL) 125 mg/2 mL injection 125 mg (125 mg Intramuscular Given 11/28/19 1819)    Initial Impression / Assessment and Plan / UC Course  I have reviewed the triage vital signs and the nursing notes.  Pertinent labs & imaging results that were available during my care of the  patient were reviewed by me and considered in my medical decision making (see chart for details).     Patient febrile, nontoxic in office today.  Patient does have moderate swelling of bilateral hands without warmth, crepitus, injury.  No urinary symptoms reported as per HPI.  Low concern for nephrotic or nephritic syndrome.  Provided contact information for PCP with instructions to follow up for routine health screenings and further evaluation if needed.  Given Solu-Medrol in office which he tolerated well.  Return precautions discussed, patient verbalized understanding and is agreeable to plan. Final Clinical Impressions(s) / UC Diagnoses   Final diagnoses:  Bilateral hand swelling  Lipoma of right upper extremity     Discharge Instructions     You were given a steroid shot today. Go to ER for worsening pain, swelling, rash, unable to pee, fever.    ED Prescriptions    None     I have reviewed the PDMP during this encounter.   Hall[place]vin, [place], [place] 11/28/19 1823

## 2019-11-28 NOTE — Discharge Instructions (Addendum)
You were given a steroid shot today. Go to ER for worsening pain, swelling, rash, unable to pee, fever.

## 2019-11-30 ENCOUNTER — Emergency Department (HOSPITAL_COMMUNITY): Payer: Medicaid - Out of State

## 2019-11-30 ENCOUNTER — Emergency Department (HOSPITAL_COMMUNITY)
Admission: EM | Admit: 2019-11-30 | Discharge: 2019-12-01 | Disposition: A | Discharge status: Home / Self Care | Payer: Medicaid - Out of State | Attending: Emergency Medicine | Admitting: Emergency Medicine

## 2019-11-30 ENCOUNTER — Encounter (HOSPITAL_COMMUNITY): Payer: Self-pay | Admitting: Emergency Medicine

## 2019-11-30 ENCOUNTER — Other Ambulatory Visit: Payer: Self-pay

## 2019-11-30 DIAGNOSIS — G9529 Other cord compression: Secondary | ICD-10-CM | POA: Diagnosis not present

## 2019-11-30 DIAGNOSIS — M4802 Spinal stenosis, cervical region: Secondary | ICD-10-CM | POA: Insufficient documentation

## 2019-11-30 DIAGNOSIS — M79601 Pain in right arm: Secondary | ICD-10-CM | POA: Diagnosis present

## 2019-11-30 DIAGNOSIS — R519 Headache, unspecified: Secondary | ICD-10-CM | POA: Diagnosis not present

## 2019-11-30 DIAGNOSIS — F17200 Nicotine dependence, unspecified, uncomplicated: Secondary | ICD-10-CM | POA: Diagnosis not present

## 2019-11-30 DIAGNOSIS — R531 Weakness: Secondary | ICD-10-CM | POA: Diagnosis not present

## 2019-11-30 DIAGNOSIS — G9520 Unspecified cord compression: Secondary | ICD-10-CM

## 2019-11-30 LAB — BASIC METABOLIC PANEL
Anion gap: 11 (ref 5–15)
BUN: 20 mg/dL (ref 6–20)
CO2: 20 mmol/L — ABNORMAL LOW (ref 22–32)
Calcium: 9.2 mg/dL (ref 8.9–10.3)
Chloride: 108 mmol/L (ref 98–111)
Creatinine, Ser: 0.65 mg/dL (ref 0.61–1.24)
GFR calc Af Amer: >60 mL/min (ref >60)
GFR calc non Af Amer: >60 mL/min (ref >60)
Glucose, Bld: 94 mg/dL (ref 70–99)
Potassium: 4.3 mmol/L (ref 3.5–5.1)
Sodium: 139 mmol/L (ref 135–145)

## 2019-11-30 LAB — CBC
HCT: 39.4 % (ref 39.0–52.0)
Hemoglobin: 12.8 g/dL — ABNORMAL LOW (ref 13.0–17.0)
MCH: 29.7 pg (ref 26.0–34.0)
MCHC: 32.5 g/dL (ref 30.0–36.0)
MCV: 91.4 fL (ref 80.0–100.0)
Platelets: 421 10*3/uL — ABNORMAL HIGH (ref 150–400)
RBC: 4.31 MIL/uL (ref 4.22–5.81)
RDW: 13.5 % (ref 11.5–15.5)
WBC: 8.4 10*3/uL (ref 4.0–10.5)
nRBC: 0 % (ref 0.0–0.2)

## 2019-11-30 LAB — URINALYSIS, ROUTINE W REFLEX MICROSCOPIC
Bilirubin Urine: NEGATIVE
Glucose, UA: NEGATIVE mg/dL
Hgb urine dipstick: NEGATIVE
Ketones, ur: NEGATIVE mg/dL
Leukocytes,Ua: NEGATIVE
Nitrite: NEGATIVE
Protein, ur: NEGATIVE mg/dL
Specific Gravity, Urine: 1.02 (ref 1.005–1.030)
pH: 5 (ref 5.0–8.0)

## 2019-11-30 LAB — TROPONIN I (HIGH SENSITIVITY)
Troponin I (High Sensitivity): 4 ng/L (ref <18)
Troponin I (High Sensitivity): 4 ng/L (ref <18)

## 2019-11-30 MED ORDER — SODIUM CHLORIDE 0.9% FLUSH: 3.0000 mL | Freq: Once | INTRAVENOUS | Status: DC

## 2019-11-30 MED ORDER — HYDROCODONE-ACETAMINOPHEN 5-325 MG PO TABS
1.0000 | ORAL_TABLET | Freq: Once | ORAL | Status: AC
  Administered 2019-11-30: 1 via ORAL
  Filled 2019-11-30: qty 1

## 2019-11-30 MED ORDER — SODIUM CHLORIDE 0.9 % IV BOLUS (SEPSIS)
1000.0000 mL | Freq: Once | INTRAVENOUS | Status: AC
  Administered 2019-11-30: 1000 mL via INTRAVENOUS

## 2019-11-30 MED ORDER — PREDNISONE 20 MG PO TABS
60.0000 mg | ORAL_TABLET | Freq: Once | ORAL | Status: AC
  Administered 2019-11-30: 60 mg via ORAL
  Filled 2019-11-30: qty 3

## 2019-11-30 NOTE — ED Triage Notes (Addendum)
c/o "pins and swelling" in hands and feet- also states that hands and feet are swollen- feet are completely numb- has been going for past few months.  C/o not sleeping x 2 days.  States urine looks like dark tea.  Multiple complaints-

## 2019-11-30 NOTE — ED Provider Notes (Signed)
[facility] EMERGENCY DEPARTMENT Provider Note   CSN: [id number] Arrival date & time: 11/30/19  1403     History Chief Complaint  Patient presents with  . multiple complaints  . dark urine    Jordan Tanner is a 59 y.o. male.  Patient is a 59 year old gentleman who reports a history of scoliosis who presents emergency department for pain, numbness and tingling in his bilateral upper and lower extremity for the past 6 months.  Patient reports that he is having significant pain and burning sensation in his entire arms and entire legs and hands and feet.  Reports that he also feels very numb in his hands and feet.  Reports that he has a lot of pain when he was standing up and it takes him a long time to stand up.  Back pain reports in the morning time he feels stiffness in all of his joints as well which is very painful.  He is looking for pain relief.  He does not have a primary care doctor and is moved here from [place] 6 months ago.        History reviewed. No pertinent past medical history.  There are no problems to display for this patient.        No family history on file.  Social History   Tobacco Use  . Smoking status: Current Every Day Smoker    Packs/day: 2.00  . Smokeless tobacco: Never Used  Vaping Use  . Vaping Use: Never used  Substance Use Topics  . Alcohol use: Not Currently    Comment: none in 9 months  . Drug use: Not on file    Comment: "clean for 1 yr"     Home Medications Prior to Admission medications   Not on File    Allergies    Patient has no allergy information on record.  Review of Systems   Review of Systems  Constitutional: Negative for chills and fever.  HENT: Negative for congestion.   Eyes: Negative for visual disturbance.  Respiratory: Negative for cough and shortness of breath.   Cardiovascular: Negative for chest pain.  Gastrointestinal: Negative for abdominal pain.  Genitourinary: Negative for  dysuria.  Musculoskeletal: Positive for arthralgias, back pain, gait problem, joint swelling, myalgias, neck pain and neck stiffness.  Skin: Negative for rash and wound.  Neurological: Positive for light-headedness, numbness and headaches. Negative for dizziness, tremors, seizures, syncope, facial asymmetry, speech difficulty and weakness.  All other systems reviewed and are negative.   Physical Exam Updated Vital Signs BP 132/85 (BP Location: Right Arm)   Pulse 84   Temp 98.3 F (36.8 C) (Oral)   Resp 18   Ht 5\' 9"  (1.753 m)   Wt 78 kg   SpO2 100%   BMI 25.40 kg/m   Physical Exam Vitals and nursing note reviewed.  Constitutional:      General: He is not in acute distress.    Appearance: Normal appearance. He is not ill-appearing, toxic-appearing or diaphoretic.  HENT:     Head: Normocephalic.  Eyes:     Conjunctiva/sclera: Conjunctivae normal.  Pulmonary:     Effort: Pulmonary effort is normal.  Skin:    General: Skin is dry.  Neurological:     Mental Status: He is alert and oriented to person, place, and time.     Cranial Nerves: No cranial nerve deficit.     Sensory: Sensory deficit present.     Motor: Weakness (Bilateral grip strength) present. No seizure  activity or pronator drift.     Coordination: Coordination normal.     Deep Tendon Reflexes: Reflexes abnormal (Hyperreflexia). Babinski sign absent on the right side. Babinski sign absent on the left side.     Comments: Exam is inconsistent due to lack of patient effort. Gait is stiff and secondary to pain but otherwise normal.  He does have unsustained clonus bilaterally with 6 beats on the right and 4 on the left at the ankles. Weakened bilateral grip strength  Psychiatric:        Mood and Affect: Mood normal.     ED Results / Procedures / Treatments   Labs (all labs ordered are listed, but only abnormal results are displayed) Labs Reviewed  BASIC METABOLIC PANEL - Abnormal; Notable for the following  components:      Result Value   CO2 20 (*)    All other components within normal limits  CBC - Abnormal; Notable for the following components:   Hemoglobin 12.8 (*)    Platelets 421 (*)    All other components within normal limits  URINALYSIS, ROUTINE W REFLEX MICROSCOPIC  TROPONIN I (HIGH SENSITIVITY)  TROPONIN I (HIGH SENSITIVITY)    EKG None  Radiology DG Chest 2 View  Result Date: 11/30/2019 CLINICAL DATA:  Weakness. EXAM: CHEST - 2 VIEW COMPARISON:  None. FINDINGS: The heart size and mediastinal contours are within normal limits. Both lungs are clear. Lungs appear somewhat hyperexpanded. The visualized skeletal structures are unremarkable. IMPRESSION: No active cardiopulmonary disease. Electronically Signed   By: Constance Holster M.D.   On: 11/30/2019 14:53    Procedures Procedures (including critical care time)  Medications Ordered in ED Medications  sodium chloride flush (NS) 0.9 % injection 3 mL (3 mLs Intravenous Not Given 11/30/19 2309)  sodium chloride 0.9 % bolus 1,000 mL (has no administration in time range)  predniSONE (DELTASONE) tablet 60 mg (60 mg Oral Given 11/30/19 2308)  HYDROcodone-acetaminophen (NORCO/VICODIN) 5-325 MG per tablet 1 tablet (1 tablet Oral Given 11/30/19 2308)    ED Course  I have reviewed the triage vital signs and the nursing notes.  Pertinent labs & imaging results that were available during my care of the patient were reviewed by me and considered in my medical decision making (see chart for details).  Clinical Course as of Dec 01 652  Wed Nov 30, 2019  2334 Patient presents with complaints of pain, tingling and numbness in bilat upper and lower extremities for 6 months. Hx of scoliosis, back and neck pain. He has an inconsistent neuro exam but does have +clonus bilat at the ankles. He was given norco and prednisone. Labs, EKG reassuring. Patient daughter made [place] aware that patient is an heroin user. Will obtain head and neck Ct at this  time and likely discuss with neuro given his widespread neuro complaints.    [KM]  Thu Dec 01, 2019  0045 Discussed patient with Dr. Malen Gauze who advises that the patient will need an MRI of the C-spine for upper motor neuron findings.   [KM]  6226 Some signs of cord compression on MRI which is multifactorial and chronic. Discussed with Dr. Arnoldo Morale from neurosurgery who will see patient in office next Tuesday. Advised to start medrol dse pack. Patient is a recovering opiate addict.    [KM]    Clinical Course User Index [KM] Kristine Royal   MDM Rules/Calculators/A&P  Based on review of vitals, medical screening exam, lab work and/or imaging, there does not appear to be an acute, emergent etiology for the patient's symptoms. Counseled pt on good return precautions and encouraged both PCP and ED follow-up as needed.  Prior to discharge, I also discussed incidental imaging findings with patient in detail and advised appropriate, recommended follow-up in detail.  Clinical Impression: 1. Cervical spinal stenosis   2. Cervical spinal cord compression (HCC)     Disposition: Discharge  Prior to providing a prescription for a controlled substance, I independently reviewed the patient's recent prescription history on the [place]. The patient had no recent or regular prescriptions and was deemed appropriate for a brief, less than 3 day prescription of narcotic for acute analgesia.  This note was prepared with assistance of Systems analyst. Occasional wrong-word or sound-a-like substitutions may have occurred due to the inherent limitations of voice recognition software.  Final Clinical Impression(s) / ED Diagnoses Final diagnoses:  None    Rx / DC Orders ED Discharge Orders    None       Kristine Royal 12/02/19 0654    Ezequiel Essex, MD 12/02/19 [phone]

## 2019-12-01 ENCOUNTER — Encounter: Payer: Self-pay | Admitting: Emergency Medicine

## 2019-12-01 ENCOUNTER — Emergency Department (HOSPITAL_COMMUNITY): Payer: Medicaid - Out of State

## 2019-12-01 LAB — CK: Total CK: 206 U/L (ref 49–397)

## 2019-12-01 MED ORDER — LORAZEPAM 2 MG/ML IJ SOLN
1.0000 mg | Freq: Once | INTRAMUSCULAR | Status: AC
  Administered 2019-12-01: 1 mg via INTRAVENOUS
  Filled 2019-12-01: qty 1

## 2019-12-01 MED ORDER — MORPHINE SULFATE (PF) 4 MG/ML IV SOLN
4.0000 mg | Freq: Once | INTRAVENOUS | Status: AC
  Administered 2019-12-01: 4 mg via INTRAVENOUS
  Filled 2019-12-01: qty 1

## 2019-12-01 MED ORDER — METHYLPREDNISOLONE 4 MG PO TBPK: ORAL_TABLET | ORAL | 0 refills | Status: AC

## 2019-12-01 NOTE — Discharge Instructions (Signed)
You are seen today for pain, swelling and numbness in your arms and legs.  Your lab work was reassuring.  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  We spoke to the neurosurgeon about this.  He would like for you to see him in the office on Tuesday to discuss further options including surgical options to help treat your symptoms and pain.  We are going to start you on some medications that will help decrease the pain and swelling.

## 2019-12-01 NOTE — ED Notes (Signed)
Please call niece Niger Williams @ [phone] a status update--Jordan Tanner

## 2019-12-01 NOTE — ED Notes (Signed)
Patient transported to MRI 

## 2020-01-25 ENCOUNTER — Ambulatory Visit: Payer: PRIVATE HEALTH INSURANCE | Admitting: Registered Nurse

## 2022-02-20 IMAGING — MR MR CERVICAL SPINE W/O CM
6 of 7 series · 37 of 48 positions shown · non-contrast
Comparison: None.

CLINICAL DATA: Acute headache. Pins and needle sensation in hands
and feet. Preceding cervical MRI showing cord impingement.

EXAM:
MRI CERVICAL SPINE WITHOUT CONTRAST
TECHNIQUE: Multiplanar, multisequence MR imaging of the cervical spine was
performed. No intravenous contrast was administered.

[Series 5: T2 · sagittal · 3.0mm · 0.69mm/px · 4 of 15 slices shown (1 of 3)]
[im 1/15]
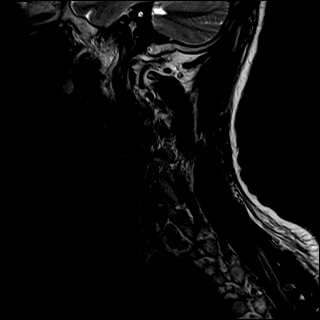
[im 5/15]
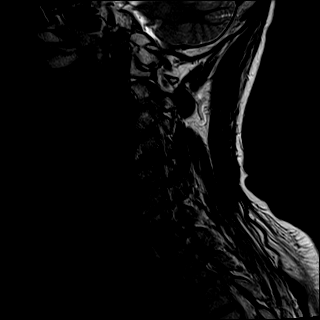
[im 10/15]
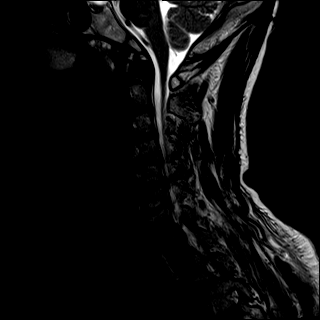
[im 15/15]
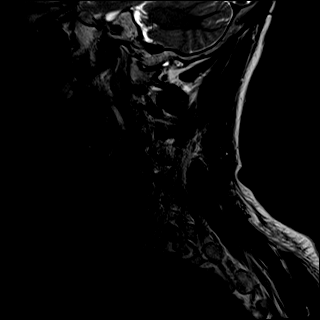

[Series 6: T1 · sagittal · 3.0mm · 0.69mm/px · 4 of 15 slices shown (1 of 2)]
[im 1/15]
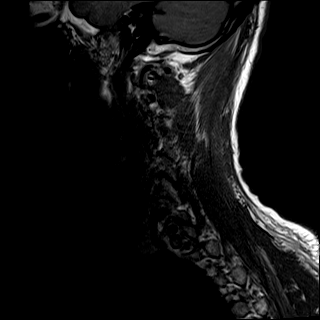
[im 5/15]
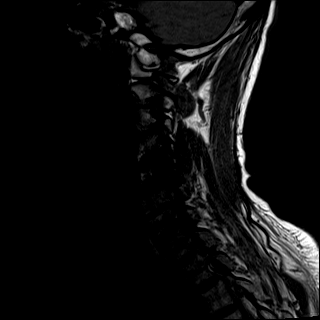
[im 10/15]
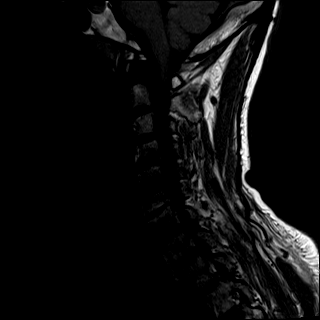
[im 15/15]
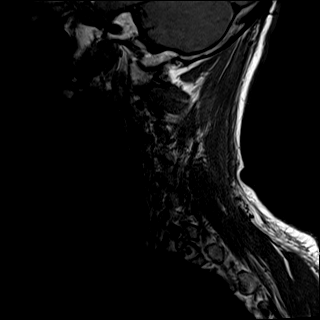

[Series 7: STIR · sagittal · 3.0mm · 0.86mm/px · 3 of 15 slices shown]
[im 1/15]
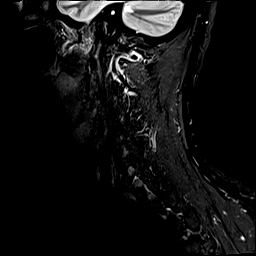
[im 5/15]
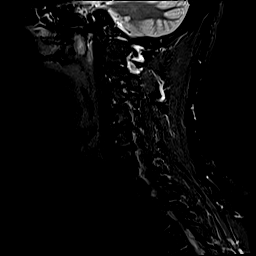
[im 10/15]
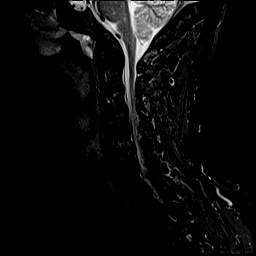

[Series 8: T2 · axial · 3.0mm · 0.66mm/px · z∈[-111,+15]mm · 9 of 39 slices shown (2 of 3)]
[im 1/39]
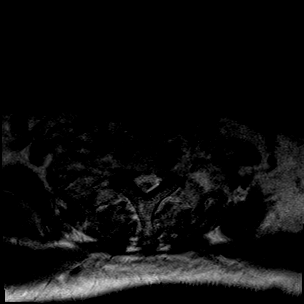
[im 5/39]
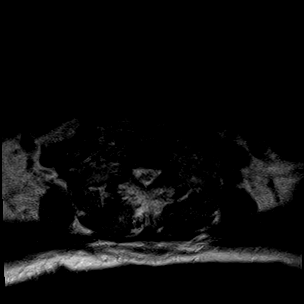
[im 10/39]
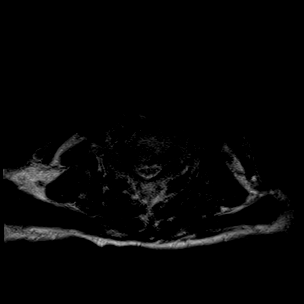
[im 15/39]
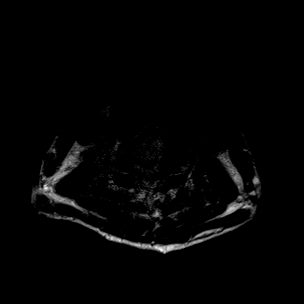
[im 20/39]
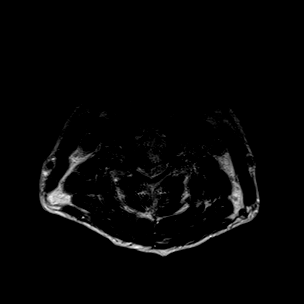
[im 24/39]
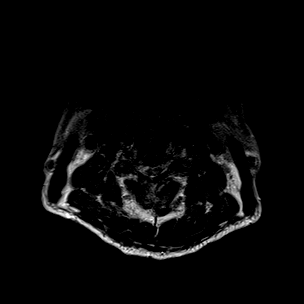
[im 29/39]
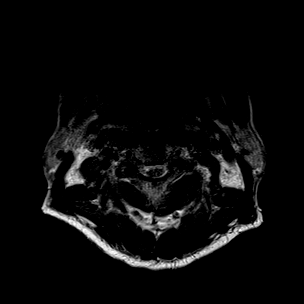
[im 34/39]
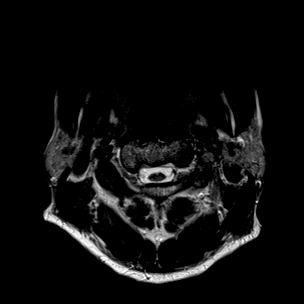
[im 39/39]
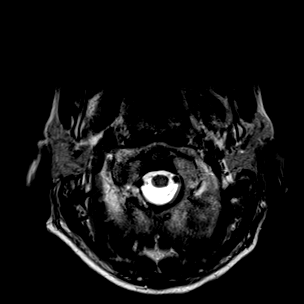

[Series 10: T1 · axial · 3.0mm · 0.39mm/px · z∈[-111,+15]mm · 8 of 40 slices shown (2 of 2)]
[im 1/40]
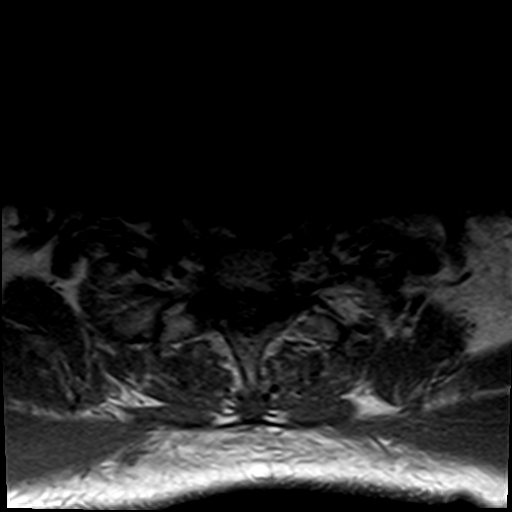
[im 5/40]
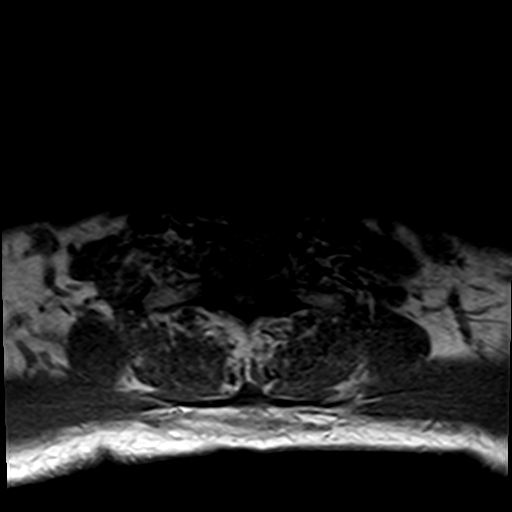
[im 10/40]
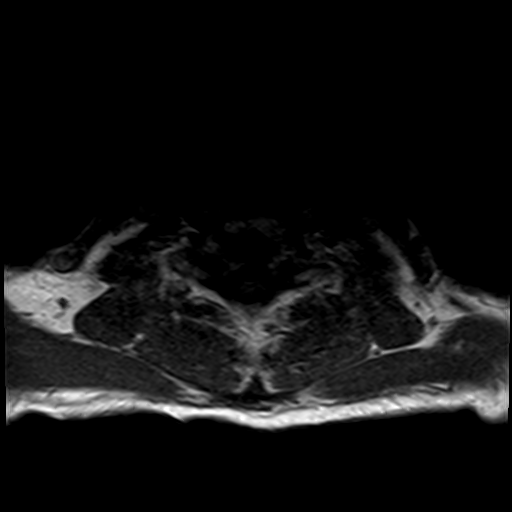
[im 15/40]
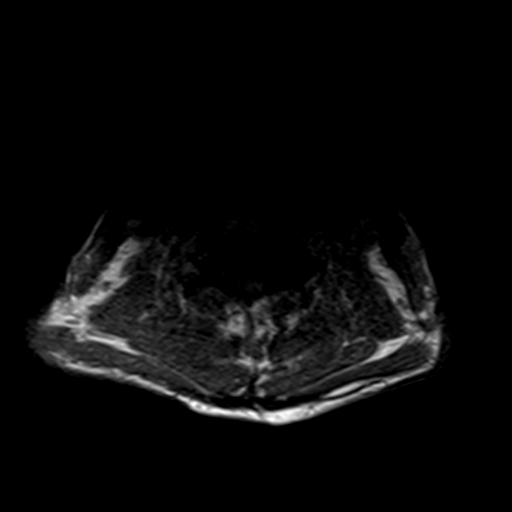
[im 25/40]
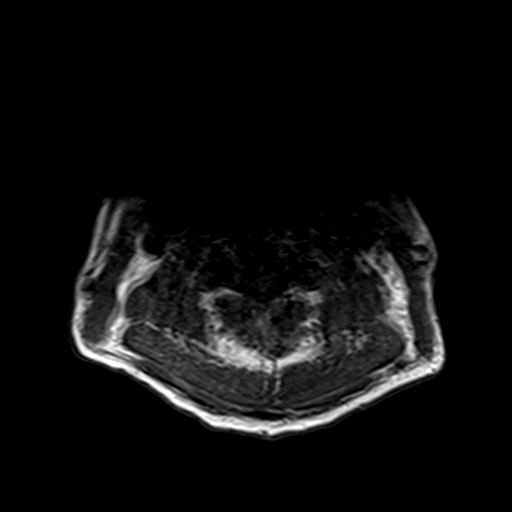
[im 30/40]
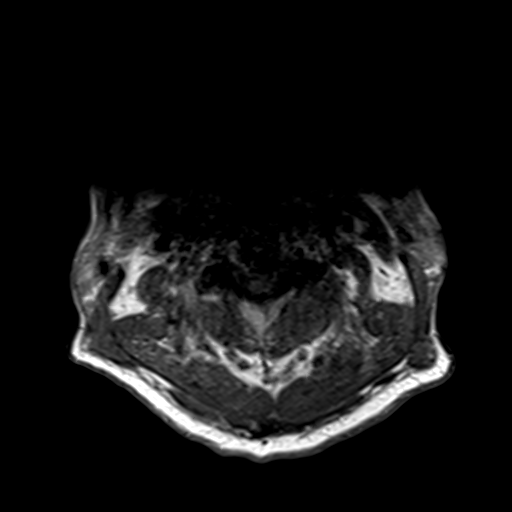
[im 35/40]
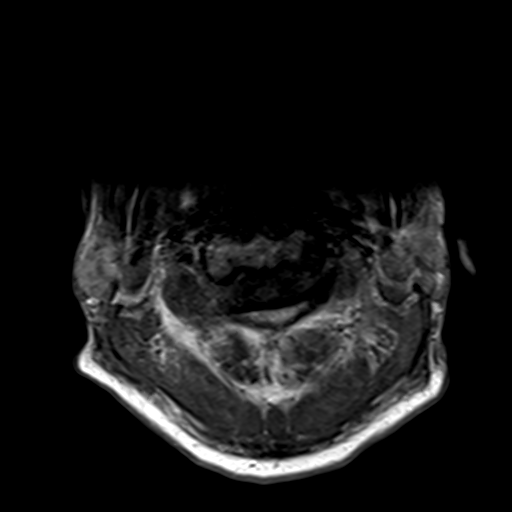
[im 40/40]
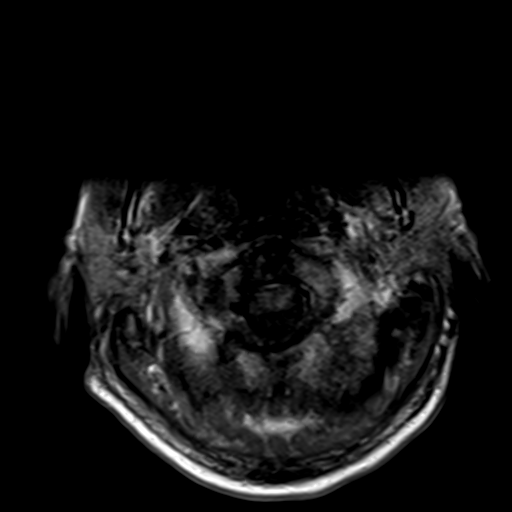

[Series 11: T2 · axial · 3.0mm · 0.66mm/px · z∈[-111,+15]mm · 9 of 40 slices shown (3 of 3)]
[im 1/40]
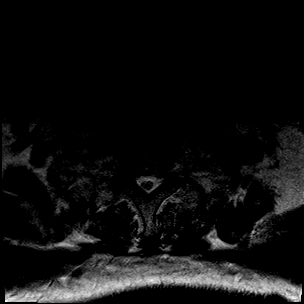
[im 5/40]
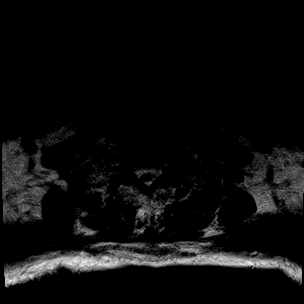
[im 10/40]
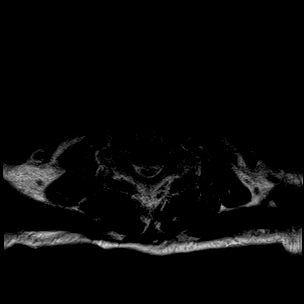
[im 15/40]
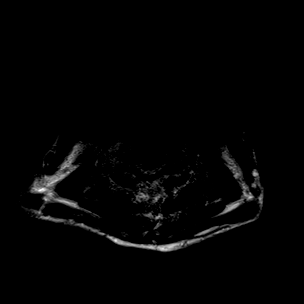
[im 20/40]
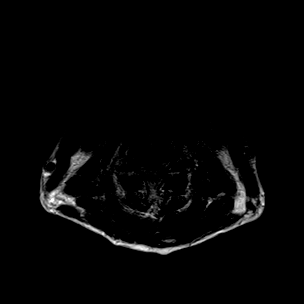
[im 25/40]
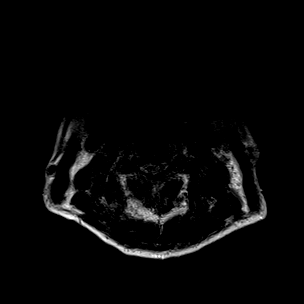
[im 30/40]
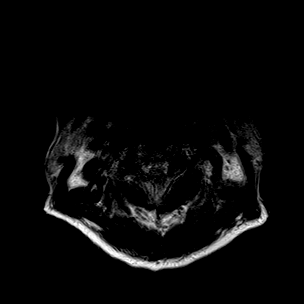
[im 35/40]
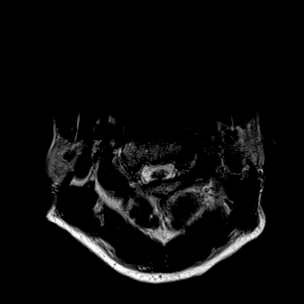
[im 40/40]
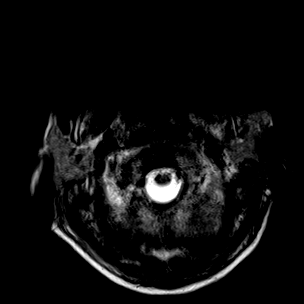

[37 of 48 positions shown; findings below may reference images not displayed]

FINDINGS: Alignment: Unremarkable

Vertebrae: No fracture, evidence of discitis, or bone lesion.

Cord: Multifactorial cord compression with at C4-5 and C5-6. Cord T2
hyperintensity at these levels. Cord compression is known from prior
CT.

Posterior Fossa, vertebral arteries, paraspinal tissues: Negative

Disc levels:

C2-3: Mild facet spurring.  No impingement

C3-4: Small central disc protrusion without neural compression

C4-5: Disc narrowing and ridging with central protrusion. There is
mild posterior longitudinal ligament ossification by prior CT. The
spinal canal appears congenitally narrow. Cord compression with T2
hyperintensity and possible cord thinning. The cord measures 3.4 mm
anterior to posterior in the midline. Biforaminal impingement

C5-6: Disc narrowing and bulging with central protrusion and
ligamentum flavum thickening causing cord compression and cord T2
hyperintensity. Mild posterior longitudinal ligament ossification
contributes to the stenosis. The cord measures 4 mm anterior to
posterior in the midline. Biforaminal high-grade impingement

C6-7: Disc narrowing and endplate degeneration. Height loss and
uncovertebral spurs cause biforaminal impingement.

C7-T1:Minor facet spurring
IMPRESSION: 1. Multifactorial (disc degeneration, mild posterior longitudinal
ligament ossification, and congenitally narrow canal) cause advanced
spinal stenosis with cord compression at C4-5 and C5-6. There is
some mix of cord edema and myelomalacia at these levels.
2. Biforaminal impingement at C4-5 to C6-7.
